# Patient Record
Sex: Female | Born: 1985 | Race: Black or African American | Hispanic: No | Marital: Married | Smoking: Former smoker
Health system: Southern US, Community
[De-identification: ages and names within clinical notes are randomized; demographics above are authoritative.]

## PROBLEM LIST (undated history)

## (undated) DIAGNOSIS — N83209 Unspecified ovarian cyst, unspecified side: Secondary | ICD-10-CM

## (undated) DIAGNOSIS — R87619 Unspecified abnormal cytological findings in specimens from cervix uteri: Secondary | ICD-10-CM

## (undated) DIAGNOSIS — F419 Anxiety disorder, unspecified: Secondary | ICD-10-CM

## (undated) DIAGNOSIS — A048 Other specified bacterial intestinal infections: Secondary | ICD-10-CM

## (undated) DIAGNOSIS — R51 Headache: Secondary | ICD-10-CM

## (undated) DIAGNOSIS — IMO0002 Reserved for concepts with insufficient information to code with codable children: Secondary | ICD-10-CM

## (undated) DIAGNOSIS — K219 Gastro-esophageal reflux disease without esophagitis: Secondary | ICD-10-CM

## (undated) HISTORY — DX: Other specified bacterial intestinal infections: A04.8

## (undated) HISTORY — PX: ENDOMETRIAL BIOPSY: SHX622

## (undated) HISTORY — PX: CERVICAL BIOPSY: SHX590

## (undated) HISTORY — PX: TUBAL LIGATION: SHX77

---

## 2004-10-27 ENCOUNTER — Inpatient Hospital Stay (HOSPITAL_COMMUNITY): Admission: AD | Admit: 2004-10-27 | Discharge: 2004-10-27 | Payer: Self-pay | Admitting: Obstetrics

## 2004-11-14 ENCOUNTER — Inpatient Hospital Stay (HOSPITAL_COMMUNITY): Admission: AD | Admit: 2004-11-14 | Discharge: 2004-11-14 | Payer: Self-pay | Admitting: Obstetrics

## 2004-11-18 ENCOUNTER — Inpatient Hospital Stay (HOSPITAL_COMMUNITY): Admission: AD | Admit: 2004-11-18 | Discharge: 2004-11-20 | Payer: Self-pay | Admitting: Obstetrics

## 2006-01-27 DIAGNOSIS — A048 Other specified bacterial intestinal infections: Secondary | ICD-10-CM

## 2006-01-27 HISTORY — DX: Other specified bacterial intestinal infections: A04.8

## 2006-03-24 ENCOUNTER — Inpatient Hospital Stay (HOSPITAL_COMMUNITY): Admission: AD | Admit: 2006-03-24 | Discharge: 2006-03-26 | Payer: Self-pay | Admitting: Obstetrics

## 2008-02-18 ENCOUNTER — Emergency Department (HOSPITAL_COMMUNITY): Admission: EM | Admit: 2008-02-18 | Discharge: 2008-02-18 | Payer: Self-pay | Admitting: Emergency Medicine

## 2008-03-13 ENCOUNTER — Ambulatory Visit: Payer: Self-pay | Admitting: Gastroenterology

## 2008-03-13 DIAGNOSIS — R112 Nausea with vomiting, unspecified: Secondary | ICD-10-CM

## 2008-03-13 DIAGNOSIS — R197 Diarrhea, unspecified: Secondary | ICD-10-CM

## 2008-03-14 ENCOUNTER — Encounter: Payer: Self-pay | Admitting: Gastroenterology

## 2008-03-14 LAB — CONVERTED CEMR LAB
ALT: 14 units/L (ref 0–35)
AST: 19 units/L (ref 0–37)
Alkaline Phosphatase: 33 units/L — ABNORMAL LOW (ref 39–117)
BUN: 7 mg/dL (ref 6–23)
Basophils Relative: 0.2 % (ref 0.0–3.0)
CO2: 29 meq/L (ref 19–32)
Calcium: 9.9 mg/dL (ref 8.4–10.5)
Creatinine, Ser: 0.9 mg/dL (ref 0.4–1.2)
Eosinophils Relative: 3 % (ref 0.0–5.0)
GFR calc Af Amer: 100 mL/min
GFR calc non Af Amer: 82 mL/min
Glucose, Bld: 88 mg/dL (ref 70–99)
HCT: 37.8 % (ref 36.0–46.0)
Hemoglobin: 12.9 g/dL (ref 12.0–15.0)
MCV: 95.2 fL (ref 78.0–100.0)
Monocytes Absolute: 0.4 10*3/uL (ref 0.1–1.0)
Neutrophils Relative %: 52 % (ref 43.0–77.0)
Platelets: 325 10*3/uL (ref 150–400)
RBC: 3.97 M/uL (ref 3.87–5.11)
RDW: 12.5 % (ref 11.5–14.6)
Sodium: 140 meq/L (ref 135–145)
Total Protein: 7 g/dL (ref 6.0–8.3)

## 2008-03-31 ENCOUNTER — Ambulatory Visit: Payer: Self-pay | Admitting: Gastroenterology

## 2008-04-03 ENCOUNTER — Ambulatory Visit (HOSPITAL_COMMUNITY): Admission: RE | Admit: 2008-04-03 | Discharge: 2008-04-03 | Payer: Self-pay | Admitting: Gastroenterology

## 2008-04-03 LAB — CONVERTED CEMR LAB: TSH: 1.56 microintl units/mL (ref 0.35–5.50)

## 2008-04-12 ENCOUNTER — Ambulatory Visit: Payer: Self-pay | Admitting: Gastroenterology

## 2008-04-12 ENCOUNTER — Encounter: Payer: Self-pay | Admitting: Gastroenterology

## 2008-04-12 HISTORY — PX: ESOPHAGOGASTRODUODENOSCOPY: SHX1529

## 2008-04-17 ENCOUNTER — Encounter: Payer: Self-pay | Admitting: Gastroenterology

## 2008-06-20 ENCOUNTER — Ambulatory Visit: Payer: Self-pay | Admitting: Gastroenterology

## 2008-09-20 ENCOUNTER — Emergency Department (HOSPITAL_COMMUNITY): Admission: EM | Admit: 2008-09-20 | Discharge: 2008-09-21 | Payer: Self-pay | Admitting: Emergency Medicine

## 2008-12-19 ENCOUNTER — Ambulatory Visit: Payer: Self-pay | Admitting: Gastroenterology

## 2008-12-19 DIAGNOSIS — R1084 Generalized abdominal pain: Secondary | ICD-10-CM | POA: Insufficient documentation

## 2008-12-27 ENCOUNTER — Ambulatory Visit: Payer: Self-pay | Admitting: Nurse Practitioner

## 2009-01-08 ENCOUNTER — Ambulatory Visit (HOSPITAL_COMMUNITY): Admission: RE | Admit: 2009-01-08 | Discharge: 2009-01-08 | Payer: Self-pay | Admitting: Gastroenterology

## 2009-01-28 ENCOUNTER — Inpatient Hospital Stay (HOSPITAL_COMMUNITY): Admission: AD | Admit: 2009-01-28 | Discharge: 2009-01-28 | Payer: Self-pay | Admitting: Obstetrics and Gynecology

## 2009-02-27 LAB — CONVERTED CEMR LAB

## 2009-06-27 ENCOUNTER — Telehealth: Payer: Self-pay | Admitting: Gastroenterology

## 2009-06-29 ENCOUNTER — Emergency Department (HOSPITAL_COMMUNITY): Admission: EM | Admit: 2009-06-29 | Discharge: 2009-06-29 | Payer: Self-pay | Admitting: Emergency Medicine

## 2009-09-28 ENCOUNTER — Emergency Department (HOSPITAL_COMMUNITY): Admission: EM | Admit: 2009-09-28 | Discharge: 2009-09-28 | Payer: Self-pay | Admitting: Emergency Medicine

## 2010-01-15 ENCOUNTER — Ambulatory Visit: Payer: Self-pay | Admitting: Internal Medicine

## 2010-01-15 DIAGNOSIS — R51 Headache: Secondary | ICD-10-CM

## 2010-01-15 DIAGNOSIS — R519 Headache, unspecified: Secondary | ICD-10-CM | POA: Insufficient documentation

## 2010-01-16 LAB — CONVERTED CEMR LAB
AST: 19 units/L (ref 0–37)
Albumin: 4.1 g/dL (ref 3.5–5.2)
Alkaline Phosphatase: 34 units/L — ABNORMAL LOW (ref 39–117)
BUN: 10 mg/dL (ref 6–23)
Basophils Relative: 0.5 % (ref 0.0–3.0)
Calcium: 9.4 mg/dL (ref 8.4–10.5)
Chloride: 105 meq/L (ref 96–112)
HCT: 36.1 % (ref 36.0–46.0)
HDL: 60.8 mg/dL (ref >39.00)
LDL Cholesterol: 101 mg/dL — ABNORMAL HIGH (ref 0–99)
Leukocytes, UA: NEGATIVE
Neutro Abs: 5 10*3/uL (ref 1.4–7.7)
Neutrophils Relative %: 54.8 % (ref 43.0–77.0)
Platelets: 298 10*3/uL (ref 150.0–400.0)
Potassium: 4.2 meq/L (ref 3.5–5.1)
RDW: 13.2 % (ref 11.5–14.6)
Total Bilirubin: 0.6 mg/dL (ref 0.3–1.2)
Total Protein: 7 g/dL (ref 6.0–8.3)
Triglycerides: 69 mg/dL (ref 0.0–149.0)
VLDL: 13.8 mg/dL (ref 0.0–40.0)
WBC: 9.2 10*3/uL (ref 4.5–10.5)
pH: 7 (ref 5.0–8.0)

## 2010-01-17 ENCOUNTER — Telehealth: Payer: Self-pay | Admitting: Internal Medicine

## 2010-01-17 ENCOUNTER — Encounter (INDEPENDENT_AMBULATORY_CARE_PROVIDER_SITE_OTHER): Payer: Self-pay | Admitting: *Deleted

## 2010-02-26 NOTE — Progress Notes (Signed)
Summary: CONSTIPATION  Phone Note Call from Patient Call back at Home Phone [phone]   Caller: Renee Pain daughter in law. Call For: Dr Christella Hartigan Reason for Call: Talk to Nurse Summary of Call: No Bm's since last thursday.  Initial call taken by: Leanor Kail [facility],  June 27, 2009 3:15 PM  Follow-up for Phone Call        Pt. c/o constipation, no BM for 5 days, then had dark, loose stools and vomited on Thursday. NO BM since then.  Today pt. feels very bloated, some abd. pain. Throat is sore & swollen, feels she may have strep. throat, will see her PCP about that.   1) Miralax 17gm mixed in 8oz. water or juice-daily.May use BID PRN.  Take a dose as soon as you get it, another at bedtime tonight and again in the morning.  2) Call with an update tomorrow, sooner if needed 3) If symptoms become worse call back immediately or go to ER. 4) I will call pt., if new orders, after MD reviews.   Follow-up by: Laureen Ochs LPN,  June 27, 6576 4:32 PM  Additional Follow-up for Phone Call Additional follow up Details #1::        agree, no changes Additional Follow-up by: Rachael Fee MD,  June 28, 2009 7:26 AM

## 2010-02-28 NOTE — Letter (Signed)
Summary: Out of Work  [facility]  [address] [place], [place] [address]   Phone: [phone]  Fax: [phone]    January 17, 2010   Employee:  Declynn Fiske    To Whom It May Concern:   For Medical reasons, please excuse the above named employee from work for the following dates:  Start:   Thursday December 22th 2011  End:   Saturday December 24th 2011 - To return Monday December 26th 2011  If you need additional information, please feel free to contact our office.         Sincerely,    Margaret Pyle, CMA  Rene Paci MD

## 2010-02-28 NOTE — Assessment & Plan Note (Signed)
Summary: NEW PT/[employer]/#/LB   Vital Signs:  Patient profile:   25 year old female Height:      66 inches (167.64 cm) Weight:      172.4 pounds (78.36 kg) BMI:     27.93 O2 Sat:      99 % on Room air Temp:     98.3 degrees F (36.83 degrees C) oral Pulse rate:   59 / minute BP sitting:   100 / 62  (left arm) Cuff size:   regular  Vitals Entered By: Orlan Leavens RMA (January 15, 2010 3:04 PM)  O2 Flow:  Room air CC: New patient Is Patient Diabetic? No Pain Assessment Patient in pain? no        Primary Care Provider:  Newt Lukes MD  CC:  New patient.  History of Present Illness: new pt to me and our division - here to est care patient is here today for annual physical. Patient feels well and has no complaints.   concern about  diarrhea assoc with occ epigastric bloating and pain along right abd not changed with type of food or fasting c/o infreq BM - every 4-5 days, - cramping and diarrhea "all day" when BM does occur no fever, no brbpr - no weight loss no n/v no international or ship travel, no outdoor exposures no new foods - denies stress or depression/anxiety  Preventive Screening-Counseling & Management  Alcohol-Tobacco     Alcohol drinks/day: 0     Alcohol Counseling: not indicated; patient does not drink     Smoking Status: never     Tobacco Counseling: not indicated; no tobacco use  Caffeine-Diet-Exercise     Diet Counseling: to improve diet; diet is suboptimal     Does Patient Exercise: no     Exercise Counseling: to improve exercise regimen     Depression Counseling: not indicated; screening negative for depression  Safety-Violence-Falls     Seat Belt Counseling: not indicated; patient wears seat belts     Helmet Counseling: not indicated; patient wears helmet when riding bicycle/motocycle     Firearm Counseling: not applicable     Violence Counseling: not applicable     Fall Risk Counseling: not indicated; no significant falls  noted  Clinical Review Panels:  Prevention   Last Pap Smear:  Interpretation Result:Negative for intraepithelial Lesion or Malignancy.    (02/27/2009)  CBC   WBC:  7.1 (03/13/2008)   RBC:  3.97 (03/13/2008)   Hgb:  12.9 (03/13/2008)   Hct:  37.8 (03/13/2008)   Platelets:  325 (03/13/2008)   MCV  95.2 (03/13/2008)   MCHC  34.2 (03/13/2008)   RDW  12.5 (03/13/2008)   PMN:  52.0 (03/13/2008)   Lymphs:  38.5 (03/13/2008)   Monos:  6.3 (03/13/2008)   Eosinophils:  3.0 (03/13/2008)   Basophil:  0.2 (03/13/2008)  Complete Metabolic Panel   Glucose:  88 (03/13/2008)   Sodium:  140 (03/13/2008)   Potassium:  4.3 (03/13/2008)   Chloride:  105 (03/13/2008)   CO2:  29 (03/13/2008)   BUN:  7 (03/13/2008)   Creatinine:  0.9 (03/13/2008)   Albumin:  4.1 (03/13/2008)   Total Protein:  7.0 (03/13/2008)   Calcium:  9.9 (03/13/2008)   Total Bili:  0.7 (03/13/2008)   Alk Phos:  33 (03/13/2008)   SGPT (ALT):  14 (03/13/2008)   SGOT (AST):  19 (03/13/2008)   Current Medications (verified): 1)  Mirena 20 Mcg/24hr Iud (Levonorgestrel) .... 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 I have reviewed all other systems and they were negative.   Physical Exam  General:  overweight-appearing.  alert, well-developed, well-nourished, and cooperative to examination.    Head:  Normocephalic and atraumatic without obvious abnormalities. No apparent alopecia or balding. Eyes:  vision grossly intact; pupils equal, round and reactive to  light.  conjunctiva and lids normal.    Ears:  normal pinnae bilaterally, without erythema, swelling, or tenderness to palpation. TMs clear, without effusion, or cerumen impaction. Hearing grossly normal bilaterally  Mouth:  teeth and gums in good repair; mucous membranes moist, without lesions or ulcers. oropharynx clear without exudate, no erythema.  Neck:  thick, supple, full ROM, no masses, no thyromegaly; no thyroid nodules or tenderness. no JVD or carotid bruits.   Lungs:  normal respiratory effort, no intercostal retractions or use of accessory muscles; normal breath sounds bilaterally - no crackles and no wheezes.    Heart:  normal rate, regular rhythm, no murmur, and no rub. BLE without edema. normal DP pulses and normal cap refill in all 4 extremities    Abdomen:  soft, non-tender, normal bowel sounds, no distention; no masses and no appreciable hepatomegaly or splenomegaly.   Msk:  No deformity or scoliosis noted of thoracic or lumbar spine.   Neurologic:  alert & oriented X3 and cranial nerves II-XII symetrically intact.  strength normal in all extremities, sensation intact to light touch, and gait normal. speech fluent without dysarthria or aphasia; follows commands with good comprehension.  Skin:  mild foillicular acne around waistline - otherwise, no rashes, vesicles, ulcers, or erythema. No nodules or irregularity to palpation.  Psych:  Oriented X3, memory intact for recent and remote, normally interactive, good eye contact, not anxious appearing, not depressed appearing, and not agitated.      Impression & Recommendations:  Problem # 1:  PREVENTIVE HEALTH CARE (ICD-V70.0) Patient has been counseled on age-appropriate routine health concerns for screening and prevention. These are reviewed and up-to-date. Immunizations are up-to-date or declined. Labs ordered - to be reviewed.  Orders: TLB-Lipid Panel (80061-LIPID) TLB-BMP (Basic Metabolic Panel-BMET) (80048-METABOL) TLB-CBC  Platelet - w/Differential (85025-CBCD) TLB-Hepatic/Liver Function Pnl (80076-HEPATIC) TLB-TSH (Thyroid Stimulating Hormone) (84443-TSH) TLB-Udip w/ Micro (81001-URINE)  Problem # 2:  DIARRHEA (ICD-787.91) hx same - ?IBS given constipation alt with diarrhea by hx - exam benign - labs to be screened with cpx as above - trial probiotic rec - if labs ok and cont symptoms, f/u for further eval as needed - pt understands and agrees to same Her updated medication list for this problem includes:    Align Caps (Probiotic product) .Marland Kitchen... 1 by mouth once daily x 2 weeks, then as needed for stomach symptoms  Complete Medication List: 1)  Mirena 20 Mcg/24hr Iud (Levonorgestrel) .... Replace every 5 years 2)  Align Caps (Probiotic product) .Marland Kitchen.. 1 by mouth once daily x 2 weeks, then as needed for stomach symptoms  Patient Instructions: 1)  it was good to see you today. 2)  test(s) ordered today - your results will be posted on the phone tree for review in 48-72 hours from the time of test completion; call [phone] and enter your 9 digit MRN (listed above on this page, just below your name); if any changes need to be made or there are abnormal results, you will be contacted directly. 3)  use Align (or other probioitc) daily x 2 weeks for your stomach symptoms - sample and coupon given to you today 4)  Please schedule a follow-up appointment in 2-4 weeks to review stomach problems and decide if other workup is needed, call sooner if problems.    Orders Added: 1)  TLB-Lipid Panel [80061-LIPID] 2)  TLB-BMP (Basic Metabolic Panel-BMET) [80048-METABOL] 3)  TLB-CBC Platelet - w/Differential [85025-CBCD] 4)  TLB-Hepatic/Liver Function Pnl [80076-HEPATIC] 5)  TLB-TSH (Thyroid Stimulating Hormone) [84443-TSH] 6)  TLB-Udip w/ Micro [81001-URINE] 7)  New Patient 18-39 years [99385]   Immunization History:  Tetanus/Td Immunization History:    Tetanus/Td:  historical (01/27/2009)   Immunization  History:  Tetanus/Td Immunization History:    Tetanus/Td:  Historical (01/27/2009)   Pap Smear  Procedure date:  02/27/2009  Findings:      Interpretation Result:Negative for intraepithelial Lesion or Malignancy.

## 2010-02-28 NOTE — Progress Notes (Signed)
Summary: Diarrhea/ work note  Phone Note Call from Patient Call back at [employer] [phone]   Caller: Patient Summary of Call: Pt called stating she is still having Diarrhea but has also vomitted this morning. Pt is feeling weak but is staying hydrated. Pt is requesting out of work note for today, tomorrow and Saturday, return Monday. Please adivse. Initial call taken by: Margaret Pyle, CMA,  January 17, 2010 10:38 AM  Follow-up for Phone Call        ok to generate work note as requested - if unable to return to work on Mon, will need ROV to reeval- thanks Follow-up by: Newt Lukes MD,  January 17, 2010 11:08 AM  Additional Follow-up for Phone Call Additional follow up Details #1::        Pt advised, letter in cabinet for pt pick up Additional Follow-up by: Margaret Pyle, CMA,  January 17, 2010 11:25 AM

## 2010-03-18 ENCOUNTER — Encounter (INDEPENDENT_AMBULATORY_CARE_PROVIDER_SITE_OTHER): Payer: Self-pay | Admitting: *Deleted

## 2010-03-18 ENCOUNTER — Ambulatory Visit (INDEPENDENT_AMBULATORY_CARE_PROVIDER_SITE_OTHER): Payer: 59 | Admitting: Internal Medicine

## 2010-03-18 ENCOUNTER — Encounter: Payer: Self-pay | Admitting: Internal Medicine

## 2010-03-18 DIAGNOSIS — J209 Acute bronchitis, unspecified: Secondary | ICD-10-CM | POA: Insufficient documentation

## 2010-03-19 ENCOUNTER — Telehealth: Payer: Self-pay | Admitting: Internal Medicine

## 2010-03-19 ENCOUNTER — Encounter (INDEPENDENT_AMBULATORY_CARE_PROVIDER_SITE_OTHER): Payer: Self-pay | Admitting: *Deleted

## 2010-03-26 NOTE — Progress Notes (Signed)
Summary: Work Note   Phone Note Call from Patient Call back at [employer] [phone] [facility]     Caller: Patient Summary of Call: Pt called requesting work note begin Feb 18 to Feb 25. Okay to change? Initial call taken by: Margaret Pyle, CMA,  March 19, 2010 11:15 AM  Follow-up for Phone Call        ok Follow-up by: Newt Lukes MD,  March 19, 2010 12:22 PM  Additional Follow-up for Phone Call Additional follow up Details #1::        Pt advised, letter generated and placed in cabinet for pick up Additional Follow-up by: Margaret Pyle, CMA,  March 19, 2010 2:08 PM

## 2010-03-26 NOTE — Letter (Signed)
Summary: Out of Work  [facility]  [address] [place], [place] [address]   Phone: [phone]  Fax: [phone]    March 18, 2010   Employee:  Emma Gardner    To Whom It May Concern:   For Medical reasons, please excuse the above named employee from work for the following dates:  Start: 03/18/10    End: 03/23/10    If you need additional information, please feel free to contact our office.         Sincerely,    Dr. Rene Paci

## 2010-03-26 NOTE — Letter (Signed)
Summary: Out of Work  [facility]  [address] [place], [place] [address]   Phone: [phone]  Fax: [phone]    March 19, 2010   Employee:  Emma Gardner    To Whom It May Concern:   For Medical reasons, please excuse the above named employee from work for the following dates:  Start:   03/16/2010  End:   03/23/2010  If you need additional information, please feel free to contact our office.         Sincerely,    Margaret Pyle, CMA for Dr Rene Paci

## 2010-03-26 NOTE — Assessment & Plan Note (Signed)
Summary: sore throat/sinus inf   Vital Signs:  Patient profile:   25 year old female Height:      66 inches (167.64 cm) Weight:      172 pounds (78.18 kg) O2 Sat:      98 % on Room air Temp:     99.5 degrees F (37.50 degrees C) oral Pulse rate:   83 / minute BP sitting:   102 / 82  (left arm) Cuff size:   large  Vitals Entered By: Orlan Leavens RMA (March 18, 2010 2:38 PM)  O2 Flow:  Room air CC: sore throat/ chills/ bodyache x's 4 days, URI symptoms Is Patient Diabetic? No Pain Assessment Patient in pain? no        Primary Care Provider:  Newt Lukes MD  CC:  sore throat/ chills/ bodyache x's 4 days and URI symptoms.  History of Present Illness:  URI Symptoms      This is a 24 year old woman who presents with URI symptoms.  The symptoms began 4 days ago.  The severity is described as moderate.  progressive symptoms since onset.  The patient reports nasal congestion, sore throat, dry cough, productive cough, earache, and sick contacts, but denies clear nasal discharge and purulent nasal discharge.  Associated symptoms include fever.  The patient denies stiff neck, dyspnea, wheezing, rash, vomiting, diarrhea, and use of an antipyretic.  The patient also reports headache, muscle aches, and severe fatigue.  The patient denies itchy watery eyes, sneezing, seasonal symptoms, and response to antihistamine.  The patient denies the following risk factors for Strep sinusitis: unilateral facial pain, unilateral nasal discharge, double sickening, tooth pain, Strep exposure, and tender adenopathy.    Clinical Review Panels:  CBC   WBC:  9.2 (01/15/2010)   RBC:  3.79 (01/15/2010)   Hgb:  12.4 (01/15/2010)   Hct:  36.1 (01/15/2010)   Platelets:  298.0 (01/15/2010)   MCV  95.3 (01/15/2010)   MCHC  34.5 (01/15/2010)   RDW  13.2 (01/15/2010)   PMN:  54.8 (01/15/2010)   Lymphs:  35.5 (01/15/2010)   Monos:  6.5 (01/15/2010)   Eosinophils:  2.7 (01/15/2010)   Basophil:  0.5  (01/15/2010)  Complete Metabolic Panel   Glucose:  81 (01/15/2010)   Sodium:  139 (01/15/2010)   Potassium:  4.2 (01/15/2010)   Chloride:  105 (01/15/2010)   CO2:  31 (01/15/2010)   BUN:  10 (01/15/2010)   Creatinine:  0.8 (01/15/2010)   Albumin:  4.1 (01/15/2010)   Total Protein:  7.0 (01/15/2010)   Calcium:  9.4 (01/15/2010)   Total Bili:  0.6 (01/15/2010)   Alk Phos:  34 (01/15/2010)   SGPT (ALT):  13 (01/15/2010)   SGOT (AST):  19 (01/15/2010)   Current Medications (verified): 1)  Mirena 20 Mcg/24hr Iud (Levonorgestrel) .... Replace Every 5 Years 2)  Biomedical engineer (Probiotic Product) .Marland Kitchen.. 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  Review of Systems  The patient denies weight loss, syncope, and hemoptysis.    Physical Exam  General:  overweight-appearing.  alert, well-developed, well-nourished, and cooperative to examination.  mod ill and  tired, spouse at side  Head:  Normocephalic and atraumatic without obvious abnormalities. No apparent alopecia or balding. Eyes:  vision grossly intact; pupils equal, round and reactive to light.  conjunctiva and lids normal.    Ears:  normal pinnae bilaterally, without erythema, swelling, or tenderness to palpation. TMs clear, without effusion, or cerumen impaction. Hearing grossly normal bilaterally  Mouth:  teeth and gums in good repair; mucous membranes moist, without lesions or ulcers. oropharynx clear without exudate, mod erythema. no pnd Neck:  supple, full ROM, no masses, no thyromegaly; no thyroid nodules or tenderness. no JVD or carotid bruits.  shoddy LAD Lungs:  normal respiratory effort,  no intercostal retractions or use of accessory muscles; few rhonchi breath sounds bilaterally - no crackles and no wheezes.    Heart:  normal rate, regular rhythm, no murmur, and no rub. BLE without edema. Neurologic:  alert & oriented X3 and cranial nerves II-XII symetrically intact.  strength normal in all extremities, sensation intact to light touch, and gait normal. speech fluent without dysarthria or aphasia; follows commands with good comprehension.    Impression & Recommendations:  Problem # 1:  ACUTE BRONCHITIS (ICD-466.0)  Take antibiotics and other medications as directed. Encouraged to push clear liquids, get enough rest, and take acetaminophen as needed. To be seen in 5-7 days if no improvement, sooner if worse.  Orders: Prescription Created Electronically [id number])  Her updated medication list for this problem includes:    Azithromycin 250 Mg Tabs (Azithromycin) .Marland Kitchen... 2 tabs by mouth today, then 1 by mouth daily starting tomorrow    Promethazine-codeine 6.25-10 Mg/71ml Syrp (Promethazine-codeine) .Marland Kitchen... 1 tsp by mouth every 4-6 hours as needed for cough symptoms  Complete Medication List: 1)  Mirena 20 Mcg/24hr Iud (Levonorgestrel) .... Replace every 5 years 2)  Align Caps (Probiotic product) .Marland Kitchen.. 1 by mouth once daily x 2 weeks, then as needed for stomach symptoms 3)  Azithromycin 250 Mg Tabs (Azithromycin) .... 2 tabs by mouth today, then 1 by mouth daily starting tomorrow 4)  Promethazine-codeine 6.25-10 Mg/44ml Syrp (Promethazine-codeine) .Marland Kitchen.. 1 tsp by mouth every 4-6 hours as needed for cough symptoms  Patient Instructions: 1)  it was good to see you today. 2)  Zpak and codiene cough syrup - your prescriptions have been submitted to your pharmacy. Please take as directed. Contact our office if you believe you're having problems with the medication(s).  3)  Get plenty of rest, drink lots of clear liquids, and use Tylenol or Ibuprofen for fever and comfort. Return in 7-10 days  if you're not better:sooner if you're feeling worse. 4)  work note provided today thru sat 2/25 Prescriptions: PROMETHAZINE-CODEINE 6.25-10 MG/5ML SYRP (PROMETHAZINE-CODEINE) 1 tsp by mouth every 4-6 hours as needed for cough symptoms  #6oz x 0   Entered and Authorized by:   Newt Lukes MD   Signed by:   Newt Lukes MD on 03/18/2010   Method used:   Printed then faxed to ...       [facility]       [address]       [place], [place]  [address]       Ph: [phone]       Fax: [phone]   RxID:   [id number] AZITHROMYCIN 250 MG TABS (AZITHROMYCIN) 2 tabs by mouth today, then 1 by mouth daily starting tomorrow  #6 x 0   Entered and Authorized by:  Newt Lukes MD   Signed by:   Newt Lukes MD on 03/18/2010   Method used:   Electronically to        [facility])       [address]       [place], [place]  [address]       Ph: [phone]       Fax: [phone]   RxID:   [id number]    Orders Added: 1)  Est. Patient Level IV [01751] 2)  Prescription Created Electronically [phone]

## 2010-04-14 LAB — CBC: RBC: 3.75 MIL/uL — ABNORMAL LOW (ref 3.87–5.11)

## 2010-04-14 LAB — URINE MICROSCOPIC-ADD ON

## 2010-04-14 LAB — WET PREP, GENITAL: Trich, Wet Prep: NONE SEEN

## 2010-04-14 LAB — GC/CHLAMYDIA PROBE AMP, GENITAL
Chlamydia, DNA Probe: NEGATIVE
GC Probe Amp, Genital: NEGATIVE

## 2010-04-14 LAB — DIFFERENTIAL
Basophils Relative: 0 % (ref 0–1)
Eosinophils Absolute: 0.4 10*3/uL (ref 0.0–0.7)
Eosinophils Relative: 4 % (ref 0–5)
Monocytes Absolute: 1.2 10*3/uL — ABNORMAL HIGH (ref 0.1–1.0)
Monocytes Relative: 13 % — ABNORMAL HIGH (ref 3–12)

## 2010-04-14 LAB — URINALYSIS, ROUTINE W REFLEX MICROSCOPIC
Glucose, UA: NEGATIVE mg/dL
Nitrite: NEGATIVE
Protein, ur: NEGATIVE mg/dL

## 2010-04-14 LAB — POCT PREGNANCY, URINE: Preg Test, Ur: NEGATIVE

## 2010-05-04 LAB — POCT I-STAT, CHEM 8
BUN: 9 mg/dL (ref 6–23)
Chloride: 103 mEq/L (ref 96–112)
Glucose, Bld: 90 mg/dL (ref 70–99)
HCT: 39 % (ref 36.0–46.0)
Sodium: 138 mEq/L (ref 135–145)
TCO2: 24 mmol/L (ref 0–100)

## 2010-05-13 LAB — MONONUCLEOSIS SCREEN: Mono Screen: NEGATIVE

## 2010-05-13 LAB — URINALYSIS, ROUTINE W REFLEX MICROSCOPIC
Ketones, ur: 15 mg/dL — AB
Leukocytes, UA: NEGATIVE
Nitrite: NEGATIVE
Protein, ur: NEGATIVE mg/dL
Urobilinogen, UA: 0.2 mg/dL (ref 0.0–1.0)

## 2010-05-13 LAB — COMPREHENSIVE METABOLIC PANEL
Alkaline Phosphatase: 29 U/L — ABNORMAL LOW (ref 39–117)
Calcium: 9.4 mg/dL (ref 8.4–10.5)
Creatinine, Ser: 0.85 mg/dL (ref 0.4–1.2)
GFR calc Af Amer: >60 mL/min (ref >60)
GFR calc non Af Amer: >60 mL/min (ref >60)
Sodium: 138 mEq/L (ref 135–145)
Total Bilirubin: 0.8 mg/dL (ref 0.3–1.2)

## 2010-05-13 LAB — RAPID STREP SCREEN (MED CTR MEBANE ONLY): Streptococcus, Group A Screen (Direct): NEGATIVE

## 2010-05-13 LAB — DIFFERENTIAL
Eosinophils Relative: 0 % (ref 0–5)
Monocytes Relative: 7 % (ref 3–12)
Neutro Abs: 10.3 10*3/uL — ABNORMAL HIGH (ref 1.7–7.7)
Neutrophils Relative %: 87 % — ABNORMAL HIGH (ref 43–77)

## 2010-05-13 LAB — CBC
HCT: 41 % (ref 36.0–46.0)
Hemoglobin: 13.8 g/dL (ref 12.0–15.0)
MCV: 94.9 fL (ref 78.0–100.0)
Platelets: 274 10*3/uL (ref 150–400)

## 2010-05-13 LAB — URINE MICROSCOPIC-ADD ON

## 2010-08-09 ENCOUNTER — Encounter (HOSPITAL_COMMUNITY): Payer: Self-pay | Admitting: *Deleted

## 2010-08-09 ENCOUNTER — Inpatient Hospital Stay (HOSPITAL_COMMUNITY)
Admission: AD | Admit: 2010-08-09 | Discharge: 2010-08-09 | Disposition: A | Discharge status: Home / Self Care | Payer: Medicaid Other | Source: Ambulatory Visit | Attending: Obstetrics and Gynecology | Admitting: Obstetrics and Gynecology

## 2010-08-09 ENCOUNTER — Inpatient Hospital Stay (HOSPITAL_COMMUNITY): Payer: Medicaid Other

## 2010-08-09 DIAGNOSIS — N83209 Unspecified ovarian cyst, unspecified side: Secondary | ICD-10-CM

## 2010-08-09 DIAGNOSIS — Z30431 Encounter for routine checking of intrauterine contraceptive device: Secondary | ICD-10-CM | POA: Insufficient documentation

## 2010-08-09 DIAGNOSIS — R109 Unspecified abdominal pain: Secondary | ICD-10-CM | POA: Insufficient documentation

## 2010-08-09 HISTORY — DX: Unspecified ovarian cyst, unspecified side: N83.209

## 2010-08-09 LAB — COMPREHENSIVE METABOLIC PANEL
ALT: 19 U/L (ref 0–35)
Alkaline Phosphatase: 37 U/L — ABNORMAL LOW (ref 39–117)
BUN: 11 mg/dL (ref 6–23)
CO2: 28 mEq/L (ref 19–32)
GFR calc Af Amer: >60 mL/min (ref >60)
GFR calc non Af Amer: >60 mL/min (ref >60)
Glucose, Bld: 84 mg/dL (ref 70–99)
Potassium: 4.1 mEq/L (ref 3.5–5.1)
Total Protein: 7.2 g/dL (ref 6.0–8.3)

## 2010-08-09 LAB — URINALYSIS, ROUTINE W REFLEX MICROSCOPIC
Bilirubin Urine: NEGATIVE
Ketones, ur: NEGATIVE mg/dL
Nitrite: NEGATIVE
Protein, ur: NEGATIVE mg/dL
Urobilinogen, UA: 0.2 mg/dL (ref 0.0–1.0)
pH: 6.5 (ref 5.0–8.0)

## 2010-08-09 LAB — DIFFERENTIAL
Eosinophils Absolute: 0.2 10*3/uL (ref 0.0–0.7)
Eosinophils Relative: 3 % (ref 0–5)
Lymphocytes Relative: 38 % (ref 12–46)
Lymphs Abs: 3.2 10*3/uL (ref 0.7–4.0)
Monocytes Relative: 7 % (ref 3–12)
Neutrophils Relative %: 53 % (ref 43–77)

## 2010-08-09 LAB — CBC
Hemoglobin: 12.1 g/dL (ref 12.0–15.0)
MCH: 30.8 pg (ref 26.0–34.0)
MCV: 92.1 fL (ref 78.0–100.0)
RBC: 3.93 MIL/uL (ref 3.87–5.11)
WBC: 8.4 10*3/uL (ref 4.0–10.5)

## 2010-08-09 MED ORDER — ONDANSETRON 8 MG PO TBDP
8.0000 mg | ORAL_TABLET | Freq: Once | ORAL | Status: AC
  Administered 2010-08-09: 8 mg via ORAL
  Filled 2010-08-09: qty 1

## 2010-08-09 MED ORDER — KETOROLAC TROMETHAMINE 60 MG/2ML IM SOLN
60.0000 mg | Freq: Once | INTRAMUSCULAR | Status: AC
Start: 2010-08-09 — End: 2010-08-09
  Administered 2010-08-09: 60 mg via INTRAMUSCULAR
  Filled 2010-08-09: qty 2

## 2010-08-09 MED ORDER — OXYCODONE-ACETAMINOPHEN 5-325 MG PO TABS: 1.0000 | ORAL_TABLET | Freq: Four times a day (QID) | ORAL | Status: AC | PRN

## 2010-08-09 MED ORDER — ONDANSETRON 8 MG PO TBDP: 8.0000 mg | ORAL_TABLET | Freq: Two times a day (BID) | ORAL | Status: AC | PRN

## 2010-08-09 MED ORDER — OXYCODONE-ACETAMINOPHEN 5-325 MG PO TABS
1.0000 | ORAL_TABLET | Freq: Once | ORAL | Status: AC
  Administered 2010-08-09: 1 via ORAL
  Filled 2010-08-09: qty 1

## 2010-08-09 NOTE — ED Provider Notes (Signed)
History     Chief Complaint  Patient presents with  . Abdominal Pain   Patient is a 25 y.o. female presenting with abdominal pain. The history is provided by the patient.  Abdominal Pain The primary symptoms of the illness include abdominal pain, nausea and dysuria. The primary symptoms of the illness do not include fever. The current episode started 3 to 5 hours ago. The onset of the illness was sudden. The problem has been gradually worsening.  Additional symptoms associated with the illness include chills and back pain.  States she has hx of large ovarian cyst. Has had problems in the past but today pain is severe. Pain starts in lower abdomen on the left side and radiates across lower abdomen and into back. Associated symptoms include headache, chills, nausea but no fever. Has IUD for birth control.   OB History    Grav Para Term Preterm Abortions TAB SAB Ect Mult Living                  Past Medical History  Diagnosis Date  . Ovarian cyst     Past Surgical History  Procedure Date  . Endometrial biopsy     normal    No family history on file.  History  Substance Use Topics  . Smoking status: Not on file  . Smokeless tobacco: Not on file  . Alcohol Use: Not on file    Allergies: No Known Allergies  No prescriptions prior to admission    Review of Systems  Constitutional: Positive for chills. Negative for fever.  Respiratory: Negative.   Gastrointestinal: Positive for nausea and abdominal pain.  Genitourinary: Positive for dysuria.  Musculoskeletal: Positive for back pain.  Skin: Negative.   Neurological: Positive for dizziness and headaches.   Physical Exam   Blood pressure 103/50, pulse 75, temperature 97.8 F (36.6 C), temperature source Oral, resp. rate 18.  Physical Exam  Constitutional: She is oriented to person, place, and time. She appears well-developed and well-nourished.  HENT:  Head: Normocephalic.  Eyes: Pupils are equal, round, and  reactive to light.  Neck: Neck supple.  Respiratory: Effort normal.  GI: Soft. There is tenderness.    Genitourinary: Tenderness: patient unable to tolorate pelvic exam due to pain.  Musculoskeletal: Normal range of motion.  Neurological: She is alert and oriented to person, place, and time.  Skin: Skin is warm and dry.    MAU Course  Procedures  Ultrasound show prob. Rupture of hemorrhagic cyst on the left. IUD placement appears malplaced. MDM  Pt. Returned from ultrasound and continues to have some pain after toradol.  Consult with Dr. Ambrose Mantle. Reviewed results of ultrasound findings. We will treat pain for ruptured ovarian cyst and have pt. F/u in the office with Dr. Senaida Ores for further evaluation of IUD placement.   [place], [place] 08/09/10 2022

## 2010-08-09 NOTE — Progress Notes (Signed)
Hx ovarian cyst, cannot stand due to pain, mostly on left, radiates to right, to room in wheelchair from car

## 2010-08-09 NOTE — ED Notes (Signed)
Labs being drawn, pt unable to void from abdominal pain, will wait on toradol to start working for pt to attempt to void, or may still get cath u/a if unable.

## 2010-09-03 ENCOUNTER — Ambulatory Visit (HOSPITAL_COMMUNITY): Admission: RE | Admit: 2010-09-03 | Payer: Self-pay | Source: Ambulatory Visit | Admitting: Obstetrics and Gynecology

## 2010-09-03 ENCOUNTER — Encounter (HOSPITAL_COMMUNITY): Admission: RE | Payer: Self-pay | Source: Ambulatory Visit

## 2010-09-03 SURGERY — LAPAROSCOPY OPERATIVE
Anesthesia: General

## 2010-10-24 ENCOUNTER — Encounter: Payer: Medicaid Other | Admitting: Obstetrics & Gynecology

## 2010-11-01 ENCOUNTER — Encounter: Payer: Medicaid Other | Admitting: Obstetrics & Gynecology

## 2010-11-01 ENCOUNTER — Ambulatory Visit (INDEPENDENT_AMBULATORY_CARE_PROVIDER_SITE_OTHER): Payer: Medicaid Other | Admitting: Obstetrics & Gynecology

## 2010-11-01 ENCOUNTER — Encounter: Payer: Self-pay | Admitting: Obstetrics & Gynecology

## 2010-11-01 VITALS — BP 120/80 | HR 67 | Temp 97.0°F | Wt 182.1 lb

## 2010-11-01 DIAGNOSIS — N949 Unspecified condition associated with female genital organs and menstrual cycle: Secondary | ICD-10-CM

## 2010-11-01 DIAGNOSIS — R102 Pelvic and perineal pain: Secondary | ICD-10-CM

## 2010-11-01 DIAGNOSIS — Z3009 Encounter for other general counseling and advice on contraception: Secondary | ICD-10-CM

## 2010-11-01 DIAGNOSIS — Z975 Presence of (intrauterine) contraceptive device: Secondary | ICD-10-CM

## 2010-11-01 MED ORDER — NORETHIN ACE-ETH ESTRAD-FE 1-20 MG-MCG PO TABS: 1.0000 | ORAL_TABLET | Freq: Every day | ORAL | Status: DC

## 2010-11-01 NOTE — Patient Instructions (Signed)
Oral Contraceptives (Birth Control Pills) Oral contraceptives (OCs) are medicines taken to prevent pregnancy. They are the most widely used method of birth control. OCs work by preventing the ovaries from releasing eggs. The OC hormones also cause the mucus on the cervix to thicken, preventing the sperm from entering the uterus. They also cause the lining of the uterus to become thin, not allowing a fertilized egg to attach to the inside of the uterus. OCs have a failure rate of less than 1%, when taken exactly as prescribed. THERE ARE 2 TYPES OF OC  OC that contains a mix of estrogen and progesterone hormones is the most common OC used. It is taken for 21 days, followed by 7 days of not taking the OC hormones. It can be packaged as 28 pills, with the last 7 pills being inactive. You take a pill every day. This way you do not need to remember when to restart taking the active pills. Most women will begin their menstrual period 2 to 3 days after taking the hormone pill. The menstrual period is usually lighter and shorter. This combination OC should not be taken if you are breast-feeding.   The progesterone only (minipill) OC does not contain estrogen. It is taken every day, continuously. You may have only spotting for a period, or no period at all. The progesterone only OC can be taken if you are breast-feeding your baby.  OCs come in:  Packs of 21 pills, with no pills to take for 7 days after the last pill.   Packs of 28 pills, with a pill to take every day. The last 7 pills are without hormones.   Packs of 91 pills (continuous or extended use), with a pill to take every day. The first 84 pills contain the hormones, and the last 7 pills do not. That is when you will have your menstrual period. You will not have a menstrual period during the time you are taking the first 84 pills.  HOW TO TAKE OC Your caregiver may advise you on how to start taking the first cycle of OCs. Otherwise, you can:  Start  on day 1 or day 5 of your menstrual period, taking the first pack of the OC. You will not need any backup contraceptive protection with this start time.   Start on the first Sunday after your menstrual period, day 7 of your menstrual period, or the day you get your prescription. In these cases, you will need backup contraceptive protection for the first cycle.  No matter which day you start the OC, you will always start a new pack on that same day of the week. It is a good idea to have an extra pack of OCs and a backup contraceptive method available, in case you miss some pills or lose your OC pack. COMMON REASONS FOR FAILURE   Forgetting to take the pill at the same time every day.   Poor absorption of the pill from the stomach into the bloodstream. This can be caused by diarrhea, vomiting, and the use of some medicines that kill germs (antibiotics).   Stomach or intestinal disease.   Taking OCs with other medicines that may make them less effective (carbamazepine, phenytoin, phenobarbital, rifampin).   Using OCs that have passed their expiration dates.   Forgetting to restart the pills on day 7, when using the packs of 21 pills.  If you forget to take 1 pill, take it as soon as you remember, and take the next   pill at the regular time. If you miss 2 or more pills, use backup birth control until your next menstrual period starts. Also, you may have vaginal spotting or bleeding when you miss 2 or more OC pills. If you use the pack of 28 pills or 91 pills, and you miss 1 of the last 7 pills (pills with no hormones), it will not matter. Just throw away the rest of the non-hormone pills and start a new 28 or 91 pill pack. COMMON USES OF OC  Decreasing premenstrual problems (symptoms).  Treating menstrual period cramps.   Avoiding becoming pregnant.   Regulating the menstrual cycle.   Treating acne.   Decreasing the heavy menstrual flow.   Treating dysfunctional (abnormal) uterine bleeding.    Treating chronic pelvic pain.   Treating polycystic ovary syndrome (ovary does not ovulate and produces tiny cysts).   Treating endometriosis (uterus lining growing in the pelvis, tubes, and ovaries).   Can be used for emergency contraception.   OCs DO NOT prevent sexually transmitted diseases (STDs). Safer sex practices, such as using condoms along with the pill, can help prevent STDs.  BENEFITS  OC reduces the risk of:   Cancer of the ovary and uterus.   Ovarian cysts.   Pelvic infection.   Symptoms of polycystic ovary syndrome.   Loss of bone (osteoporosis).   Noncancerous (benign) breast disease (fibrocystic breast changes).   Lack of red blood cells (anemia) from heavy or long menstrual periods.   Pregnancy occurring outside the uterus (tubal pregnancy).   Acne.   Slows down the flow of heavy menstrual periods.   Sometimes helps control premenstrual syndrome (PMS).   Stops menstrual cramps and pain.   Controls irregular menstrual periods.   Can be used as emergency contraception.  YOU SHOULD NOT TAKE THE PILL IF YOU:  Are pregnant, or are trying to get pregnant.  Have unexplained or abnormal vaginal bleeding.   Have a history of liver disease, stroke, or heart attack.   Smoke.   Have a history of blood clots, cancer, or heart problems.   Have gallbladder disease.   Have breast cancer or suspect breast cancer.   Have or suspect pelvic cancer.   Have high blood pressure.  Have high cholesterol or high triglycerides.   Have mental depression.   Are breast-feeding, except for the progesterone only OC, with approval of your caregiver.   Have diabetes with kidney, eye, or other blood vessel complications. Or if you have diabetes for 20 years or more.   Have heart valve disease.   Have migraine headaches. They may get worse.   Before taking the pill, a woman will have a physical exam and Pap test. Your caregiver may order blood tests to check  blood sugar and cholesterol levels, and other blood tests that may be necessary. SIDE EFFECTS OF THE PILL MAY INCLUDE:  Breast tenderness, pain and discharge.   Change in sex drive (increased or decreased libido).   Depression.   Being tired often.   Headaches.   Anxiety.   Irregular spotting or vaginal bleeding for a couple of months.  Leg pain.   Cramps, or swelling of your limbs (extremities).   Mood swings.   Weight loss or weight gain.   Feeling sick to your stomach (nausea).   Change in appetite (hunger).  Loss of hair.   Yeast or fungus vaginal infection.   Nervousness.   Rash.   Acne.   No menstrual period (amenorrhea).     When starting an OC, it is usually best to allow 2-3 months, if possible, for the body to adjust (before stopping because of side effects). This allows for adjustment to the changes in hormone levels. If a woman continues to have side effects, it may be possible to change to a different OC. It is important to discuss side effects with your caregiver. Often, changing to a different pill causes the side effects to subside. RISKS AND COMPLICATIONS  Blood clots of the leg, heart, lung, or brain.  High blood pressure.   Gallbladder disease.  Liver tumors.   Brain bleeding (hemorrhage).  Slight risk of breast cancer.   HOME CARE INSTRUCTIONS  Do not smoke.   Only take over-the-counter or prescription medicines for pain, discomfort, fever, or breast tenderness as directed by your caregiver.   Always use a condom to protect against sexually transmitted disease. OCs do not protect against STDs.   Keep a calendar, marking your menstrual period days.  Recommendations, types, and dosages of OC use change continually. Discuss your choices with your caregiver, and decide what is best for you. There are always exceptions to guidelines. You should always read the information that comes with the OC, and check whether there are any new  recommendations or guidelines. SEEK MEDICAL CARE IF:  You develop nausea and vomiting from the OC.   You have abnormal vaginal discharge.   You need treatment for headaches.   You develop a rash.   You miss your menstrual period.   You develop abnormal vaginal bleeding.   You are losing your hair.   You need treatment for mood swings or depression.   You get dizzy when taking the OC.   You develop acne from taking the OC.  SEEK IMMEDIATE MEDICAL CARE IF:  You develop leg pain.   You develop chest pain.   You develop shortness of breath.   You develop abdominal pain.   You have an uncontrolled headache.   You develop numbness or slurred speech.   You develop visual problems (loss of vision, double or blurry vision).   You develop heavy vaginal bleeding.  If you are taking the pill, STOP RIGHT AWAY and CALL YOUR CAREGIVER IMMEDIATELY if the following occur:  You develop chest pain and shortness of breath.   You develop pain, redness, and swelling in the legs.   You develop severe headaches, visual changes, or belly (abdominal) pain.   You develop severe depression.   You become pregnant.  Document Released: 04/05/2002 Document Re-Released: 02/04/2009 [employer] Patient Information 2011 [employer]. 

## 2010-11-01 NOTE — Progress Notes (Addendum)
  Subjective:    Patient ID: Emma Gardner, female    DOB: [birth year], 25 y.o.   MRN: [id number]  HPI [id number] Patient's last menstrual period was 10/18/2010.Pt has Mirena in place for over 4 years, U/S shows IUD imbedded, wants removal. Has frequent painful ovarian cysts, years ago had nausea on OCP, but would try them again if they help with cysts. Has considered tubal ligation or Essure  Past Medical History  Diagnosis Date  . Ovarian cyst   gastric ulcers,  Past Surgical History  Procedure Date  . Endometrial biopsy     normal  Gastric biopsy No Known Allergies History reviewed. No pertinent family history. No current outpatient prescriptions on file prior to visit.   Last pap nl    Review of Systems No pain or bleeding current, no discharge    Objective:   Physical Exam NAD, pleasant Abd not tender Pelvic  Cervix nl, string present. Consented for removal of IUD, time-out. Removed Mirena intact, uterus nl, no mass       Assessment & Plan:  IUD removed Pelvic pain and cysts Needs BCM Rx Generic Tricyclen Lo RTC 62mo

## 2011-01-15 ENCOUNTER — Inpatient Hospital Stay (HOSPITAL_COMMUNITY): Payer: Medicaid Other

## 2011-01-15 ENCOUNTER — Inpatient Hospital Stay (HOSPITAL_COMMUNITY)
Admission: AD | Admit: 2011-01-15 | Discharge: 2011-01-15 | Disposition: A | Discharge status: Home / Self Care | Payer: Medicaid Other | Source: Ambulatory Visit | Attending: Obstetrics & Gynecology | Admitting: Obstetrics & Gynecology

## 2011-01-15 ENCOUNTER — Encounter (HOSPITAL_COMMUNITY): Payer: Self-pay | Admitting: *Deleted

## 2011-01-15 DIAGNOSIS — R109 Unspecified abdominal pain: Secondary | ICD-10-CM

## 2011-01-15 DIAGNOSIS — N83202 Unspecified ovarian cyst, left side: Secondary | ICD-10-CM

## 2011-01-15 DIAGNOSIS — R1032 Left lower quadrant pain: Secondary | ICD-10-CM | POA: Insufficient documentation

## 2011-01-15 DIAGNOSIS — N83209 Unspecified ovarian cyst, unspecified side: Secondary | ICD-10-CM

## 2011-01-15 HISTORY — DX: Headache: R51

## 2011-01-15 HISTORY — DX: Reserved for concepts with insufficient information to code with codable children: IMO0002

## 2011-01-15 HISTORY — DX: Anxiety disorder, unspecified: F41.9

## 2011-01-15 HISTORY — DX: Unspecified abnormal cytological findings in specimens from cervix uteri: R87.619

## 2011-01-15 LAB — URINALYSIS, ROUTINE W REFLEX MICROSCOPIC
Glucose, UA: NEGATIVE mg/dL
Leukocytes, UA: NEGATIVE
Protein, ur: NEGATIVE mg/dL
Specific Gravity, Urine: 1.025 (ref 1.005–1.030)
pH: 6 (ref 5.0–8.0)

## 2011-01-15 LAB — URINE MICROSCOPIC-ADD ON

## 2011-01-15 MED ORDER — KETOROLAC TROMETHAMINE 60 MG/2ML IM SOLN
60.0000 mg | Freq: Once | INTRAMUSCULAR | Status: AC
  Administered 2011-01-15: 60 mg via INTRAMUSCULAR
  Filled 2011-01-15: qty 2

## 2011-01-15 MED ORDER — OXYCODONE-ACETAMINOPHEN 5-325 MG PO TABS
1.0000 | ORAL_TABLET | ORAL | Status: AC
  Administered 2011-01-15: 1 via ORAL
  Filled 2011-01-15: qty 1

## 2011-01-15 MED ORDER — TRAMADOL HCL ER 100 MG PO TB24: 100.0000 mg | ORAL_TABLET | Freq: Every day | ORAL | Status: DC

## 2011-01-15 MED ORDER — IBUPROFEN 600 MG PO TABS: 600.0000 mg | ORAL_TABLET | Freq: Four times a day (QID) | ORAL | Status: DC | PRN

## 2011-01-15 NOTE — ED Notes (Signed)
IUD removed in October; pt is on [facility] pill;

## 2011-01-15 NOTE — ED Provider Notes (Signed)
Attestation of Attending Supervision of Advanced Practitioner: Evaluation and management procedures were performed by the PA/NP/CNM/OB Fellow under my supervision/collaboration. Chart reviewed, and agree with management and plan.  Felicita Nuncio, M.D. 01/15/2011 2:24 PM   

## 2011-01-15 NOTE — Progress Notes (Signed)
Hx of ovariain cyst; pain started this Am on L side

## 2011-01-15 NOTE — ED Provider Notes (Signed)
History     Chief Complaint  Patient presents with  . Abdominal Pain   HPI Emma Gardner 25 y.o. had severe LLQ pain today.  Has had a ruptured ovarian cyst previously and this is the same pain.  Wants shot for pain.  Has had that before and it worked well.  Is on birth control pills.  LMP 12-24-10.  Denies vaginal discharge.  Denies dysuria.  Last seen in GYN clinic in October 2012  - Dr. Debroah Loop removed imbedded Mirena IUD and started client on pills.   OB History    Grav Para Term Preterm Abortions TAB SAB Ect Mult Living   2 2 2       2       Past Medical History  Diagnosis Date  . Ovarian cyst   . Abnormal Pap smear   . Headache   . Anxiety     Past Surgical History  Procedure Date  . Endometrial biopsy     normal  . Cervical biopsy     No family history on file.  History  Substance Use Topics  . Smoking status: Current Some Day Smoker    Types: Cigars  . Smokeless tobacco: Never Used  . Alcohol Use: No    Allergies: No Known Allergies  Prescriptions prior to admission  Medication Sig Dispense Refill  . ibuprofen (ADVIL,MOTRIN) 200 MG tablet Take 200 mg by mouth every 6 (six) hours as needed. For pain      . norethindrone-ethinyl estradiol (JUNEL FE,GILDESS FE,LOESTRIN FE) 1-20 MG-MCG tablet Take 1 tablet by mouth at bedtime.        Marland Kitchen DISCONTD: norethindrone-ethinyl estradiol (JUNEL FE 1/20) 1-20 MG-MCG tablet Take 1 tablet by mouth daily.  1 Package  11    ROS Physical Exam   Blood pressure 103/52, pulse 71, temperature 97.6 F (36.4 C), temperature source Oral, resp. rate 20, height 5\' 5"  (1.651 m), weight 184 lb (83.462 kg), last menstrual period 12/24/2010.  Physical Exam  Nursing note and vitals reviewed. Constitutional: She is oriented to person, place, and time. She appears well-developed and well-nourished.       Lying on side in bed.  Uncomfortable appearing.  HENT:  Head: Normocephalic.  Eyes: EOM are normal.  Neck: Neck supple.  GI: Soft.  There is tenderness. There is no rebound and no guarding.       Tender all across lower abdomen  Musculoskeletal: Normal range of motion.  Neurological: She is alert and oriented to person, place, and time.  Skin: Skin is warm and dry.  Psychiatric: She has a normal mood and affect.    MAU Course  Procedures Toradol 60 mg IM for pain.  On recheck, pain is now 7/10 - better but still has periodic stabbing pain which is worse. Now that pain is decreased, client able to get up to the bathroom for urine specimen. Also needed Percocet one by mouth for pain Will run GC/chlam on urine  Results for orders placed during the hospital encounter of 01/15/11 (from the past 24 hour(s))  URINALYSIS, ROUTINE W REFLEX MICROSCOPIC     Status: Abnormal   Collection Time   01/15/11 10:20 AM      Component Value Range   Color, Urine YELLOW  YELLOW    APPearance CLEAR  CLEAR    Specific Gravity, Urine 1.025  1.005 - 1.030    pH 6.0  5.0 - 8.0    Glucose, UA NEGATIVE  NEGATIVE (mg/dL)   Hgb urine  dipstick SMALL (*) NEGATIVE    Bilirubin Urine NEGATIVE  NEGATIVE    Ketones, ur NEGATIVE  NEGATIVE (mg/dL)   Protein, ur NEGATIVE  NEGATIVE (mg/dL)   Urobilinogen, UA 0.2  0.0 - 1.0 (mg/dL)   Nitrite NEGATIVE  NEGATIVE    Leukocytes, UA NEGATIVE  NEGATIVE   URINE MICROSCOPIC-ADD ON     Status: Abnormal   Collection Time   01/15/11 10:20 AM      Component Value Range   Squamous Epithelial / LPF MANY (*) RARE    RBC / HPF 0-2  <3 (RBC/hpf)  POCT PREGNANCY, URINE     Status: Normal   Collection Time   01/15/11 11:00 AM      Component Value Range   Preg Test, Ur NEGATIVE      MDM TRANSABDOMINAL AND TRANSVAGINAL ULTRASOUND OF PELVIS  Technique: Both transabdominal and transvaginal ultrasound  examinations of the pelvis were performed. Transabdominal  technique was performed for global imaging of the pelvis including  uterus, ovaries, adnexal regions, and pelvic cul-de-sac.  It was necessary to  proceed with endovaginal exam following the  transabdominal exam to visualize the endometrium and left ovarian  cystic lesion.  Comparison: 08/09/2010  Findings:  Uterus: Retroverted. Measures 8.0 x 4.5 x 4.9 cm. Normal  appearance.  Endometrium: Double layer endometrial thickness measures 6 mm  transvaginally. Normal appearance.  Right ovary: Measures 3.5 x 2.1 x 2.2 cm. Normal appearance.  Left ovary: 5.1 x 3.4 x 3.9 cm. A simple cyst seen within the  ovary measuring 4.2 x 2.6 x 3.5 cm. This has benign  characteristics.  Other Findings: A small amount of free fluid noted in cul-de-sac  and right adnexa.  IMPRESSION:  1. 4.2 cm left ovarian cyst which has benign characteristics. No  further imaging followup required in a reproductive age female.  2. Small amount of free fluid, likely physiologic.  3. Normal appearance of the uterus and right ovary.    Assessment and Plan  Left ovarian cyst Abdominal pain  Plan Will prescribe ultram for pain Can follow up in GYN clinic in 4-6 weeks  STD tests pending Continue birth control pills   BURLESON,TERRI 01/15/2011, 9:28 AM   Nolene Bernheim, NP 01/15/11 1258  Nolene Bernheim, NP 01/15/11 1303

## 2011-01-16 ENCOUNTER — Telehealth: Payer: Self-pay | Admitting: *Deleted

## 2011-01-16 DIAGNOSIS — N83209 Unspecified ovarian cyst, unspecified side: Secondary | ICD-10-CM

## 2011-01-16 LAB — GC/CHLAMYDIA PROBE AMP, URINE
Chlamydia, Swab/Urine, PCR: NEGATIVE
GC Probe Amp, Urine: NEGATIVE

## 2011-01-16 MED ORDER — HYDROCODONE-ACETAMINOPHEN 5-500 MG PO TABS: 1.0000 | ORAL_TABLET | ORAL | Status: AC | PRN

## 2011-01-16 NOTE — Telephone Encounter (Signed)
Patient states she called pharmacy and they did not have the prescription for her Vicodin.  RN verified was correct pharmacy, RN then called [facility] and spoke with Denny Peon who said prescription could not be eprescribed- that we could have a doctor fill out a prescription and fax it - Dr. Macon Large not available- RN requested Dr. Marice Potter request pt. Chart and Dr. Mont Dutton documentation- RX filled out and faxed- RN notified patient prescription could not be eprescribed , but has now been faxed and should be ready in a few hours. Pt. Voices understanding.

## 2011-01-16 NOTE — Telephone Encounter (Signed)
Pt called stated that she was seen in MAU for a cyst yesterday. She was given tramadol and ibuprofen for the pain. She was not able to get the tramadol due to cost. She wanted to know if we could prescribe percocet or vicodin. She has used both of these in the past for ovarian cysts and it seemed to work well. She is having a lot of pain and needs something other than the ibuprofen.

## 2011-01-16 NOTE — Telephone Encounter (Signed)
Called patient and notified her that Doctor has reviewed her request and rx for Vicodin sent to her pharmacy- go to MAU for worsening symptoms. Pt. Voices understanding

## 2011-01-16 NOTE — Telephone Encounter (Signed)
Dr. Macon Large- pt. Notified per your request.

## 2011-01-16 NOTE — Telephone Encounter (Signed)
Vicodin can be called in for patient for ovarian cyst pain. Order placed in EPIC.  Please advise her to return to MAU for any worsening symptoms.

## 2011-02-12 ENCOUNTER — Encounter: Payer: Medicaid Other | Admitting: Advanced Practice Midwife

## 2011-07-24 ENCOUNTER — Telehealth: Payer: Self-pay | Admitting: *Deleted

## 2011-07-24 DIAGNOSIS — Z0000 Encounter for general adult medical examination without abnormal findings: Secondary | ICD-10-CM

## 2011-07-24 NOTE — Telephone Encounter (Signed)
Message copied by Deatra James on Thu Jul 24, 2011 11:22 AM ------      Message from: Etheleen Sia      Created: Thu Jul 24, 2011  9:41 AM      Regarding: LAB       PHYSICAL LABS IN LATE AUG

## 2011-07-24 NOTE — Telephone Encounter (Signed)
Received staff msg pt made cpx for august need labs entered... 07/24/11@11 :24am/LMB

## 2011-07-28 HISTORY — PX: ESSURE TUBAL LIGATION: SUR464

## 2011-09-25 ENCOUNTER — Other Ambulatory Visit (INDEPENDENT_AMBULATORY_CARE_PROVIDER_SITE_OTHER): Payer: 59

## 2011-09-25 ENCOUNTER — Ambulatory Visit (INDEPENDENT_AMBULATORY_CARE_PROVIDER_SITE_OTHER): Payer: 59 | Admitting: Internal Medicine

## 2011-09-25 ENCOUNTER — Encounter: Payer: Self-pay | Admitting: Internal Medicine

## 2011-09-25 VITALS — BP 110/70 | HR 64 | Temp 98.4°F | Ht 66.0 in | Wt 162.1 lb

## 2011-09-25 DIAGNOSIS — Z0000 Encounter for general adult medical examination without abnormal findings: Secondary | ICD-10-CM

## 2011-09-25 LAB — LIPID PANEL
HDL: 57.5 mg/dL (ref >39.00)
LDL Cholesterol: 117 mg/dL — ABNORMAL HIGH (ref 0–99)
Total CHOL/HDL Ratio: 3
Triglycerides: 48 mg/dL (ref 0.0–149.0)
VLDL: 9.6 mg/dL (ref 0.0–40.0)

## 2011-09-25 LAB — URINALYSIS, ROUTINE W REFLEX MICROSCOPIC
Ketones, ur: NEGATIVE
Leukocytes, UA: NEGATIVE
Nitrite: NEGATIVE
Specific Gravity, Urine: >=1.03 (ref 1.000–1.030)
Urobilinogen, UA: 0.2 (ref 0.0–1.0)
pH: 6 (ref 5.0–8.0)

## 2011-09-25 LAB — CBC WITH DIFFERENTIAL/PLATELET
Basophils Absolute: 0.1 10*3/uL (ref 0.0–0.1)
Eosinophils Relative: 2.1 % (ref 0.0–5.0)
Lymphocytes Relative: 51.9 % — ABNORMAL HIGH (ref 12.0–46.0)
Monocytes Relative: 6 % (ref 3.0–12.0)
Neutrophils Relative %: 39.2 % — ABNORMAL LOW (ref 43.0–77.0)
Platelets: 328 10*3/uL (ref 150.0–400.0)
RDW: 13.5 % (ref 11.5–14.6)
WBC: 6.6 10*3/uL (ref 4.5–10.5)

## 2011-09-25 LAB — HEPATIC FUNCTION PANEL
AST: 17 U/L (ref 0–37)
Albumin: 4.3 g/dL (ref 3.5–5.2)
Alkaline Phosphatase: 26 U/L — ABNORMAL LOW (ref 39–117)
Total Protein: 7.2 g/dL (ref 6.0–8.3)

## 2011-09-25 LAB — BASIC METABOLIC PANEL
BUN: 10 mg/dL (ref 6–23)
CO2: 25 mEq/L (ref 19–32)
GFR: 75.81 mL/min (ref >60.00)
Glucose, Bld: 81 mg/dL (ref 70–99)
Potassium: 4.6 mEq/L (ref 3.5–5.1)
Sodium: 139 mEq/L (ref 135–145)

## 2011-09-25 LAB — TSH: TSH: 1.71 u[IU]/mL (ref 0.35–5.50)

## 2011-09-25 NOTE — Progress Notes (Signed)
  Subjective:    Patient ID: Emma Gardner, female    DOB: [birth year], 26 y.o.   MRN: [id number]  HPI patient is here today for annual physical. Patient feels well overall.  Past Medical History  Diagnosis Date  . Ovarian cyst   . Abnormal Pap smear   . Headache   . Anxiety   . H. pylori infection 2008   Family History  Problem Relation Age of Onset  . Dementia Paternal Grandfather   . Dementia Paternal Grandmother     History  Substance Use Topics  . Smoking status: Former Smoker    Types: Cigars    Quit date: 10/04/2010  . Smokeless tobacco: Never Used  . Alcohol Use: No    Review of Systems Constitutional: Negative for fever or weight change.  Respiratory: Negative for cough and shortness of breath.   Cardiovascular: Negative for chest pain or palpitations.  Gastrointestinal: Negative for abdominal pain, no bowel changes.  Musculoskeletal: Negative for gait problem or joint swelling.  Skin: Negative for rash.  Neurological: Negative for dizziness or headache.  No other specific complaints in a complete review of systems (except as listed in HPI above).     Objective:   Physical Exam BP 110/70  Pulse 64  Temp 98.4 F (36.9 C) (Oral)  Ht 5\' 6"  (1.676 m)  Wt 162 lb 1.9 oz (73.537 kg)  BMI 26.17 kg/m2  SpO2 98% Wt Readings from Last 3 Encounters:  09/25/11 162 lb 1.9 oz (73.537 kg)  01/15/11 184 lb (83.462 kg)  11/01/10 182 lb 1.6 oz (82.6 kg)   Constitutional: She appears well-developed and well-nourished. No distress.  HENT: Head: Normocephalic and atraumatic. Ears: B TMs ok, no erythema or effusion; Nose: Nose normal.  Mouth/Throat: Oropharynx is clear and moist. No oropharyngeal exudate.  Eyes: Conjunctivae and EOM are normal. Pupils are equal, round, and reactive to light. No scleral icterus.  Neck: Normal range of motion. Neck supple. No JVD present. No thyromegaly present.  Cardiovascular: Normal rate, regular rhythm and normal heart sounds.  No murmur  heard. No BLE edema. Pulmonary/Chest: Effort normal and breath sounds normal. No respiratory distress. She has no wheezes.  Abdominal: Soft. Bowel sounds are normal. She exhibits no distension. There is no tenderness. no masses Musculoskeletal: Normal range of motion, no joint effusions. No gross deformities Neurological: She is alert and oriented to person, place, and time. No cranial nerve deficit. Coordination normal.  Skin: Skin is warm and dry. No rash noted. No erythema.  Psychiatric: She has a normal mood and affect. Her behavior is normal. Judgment and thought content normal.   Lab Results  Component Value Date   WBC 8.4 08/09/2010   HGB 12.1 08/09/2010   HCT 36.2 08/09/2010   PLT 281 08/09/2010   GLUCOSE 84 08/09/2010   CHOL 176 01/15/2010   TRIG 69.0 01/15/2010   HDL 60.80 01/15/2010   LDLCALC 101* 01/15/2010   ALT 19 08/09/2010   AST 22 08/09/2010   NA 138 08/09/2010   K 4.1 08/09/2010   CL 104 08/09/2010   CREATININE 0.78 08/09/2010   BUN 11 08/09/2010   CO2 28 08/09/2010   TSH 1.12 01/15/2010        Assessment & Plan:  CPX/v70.0 - Patient has been counseled on age-appropriate routine health concerns for screening and prevention. These are reviewed and up-to-date. Immunizations are up-to-date or declined. Labs ordered and  reviewed.

## 2011-09-25 NOTE — Patient Instructions (Signed)
It was good to see you today. We have reviewed your prior records including labs and tests today Health Maintenance reviewed - all recommended immunizations and age-appropriate screenings are up-to-date. Test(s) ordered today. Your results will be called to you after review (48-72hours after test completion). If any changes need to be made, you will be notified at that time. Please schedule followup in 1-2 years for physical and labs, call sooner if problems.  Health Maintenance, Females A healthy lifestyle and preventative care can promote health and wellness.  Maintain regular health, dental, and eye exams.   Eat a healthy diet. Foods like vegetables, fruits, whole grains, low-fat dairy products, and lean protein foods contain the nutrients you need without too many calories. Decrease your intake of foods high in solid fats, added sugars, and salt. Get information about a proper diet from your caregiver, if necessary.   Regular physical exercise is one of the most important things you can do for your health. Most adults should get at least 150 minutes of moderate-intensity exercise (any activity that increases your heart rate and causes you to sweat) each week. In addition, most adults need muscle-strengthening exercises on 2 or more days a week.     Maintain a healthy weight. The body mass index (BMI) is a screening tool to identify possible weight problems. It provides an estimate of body fat based on height and weight. Your caregiver can help determine your BMI, and can help you achieve or maintain a healthy weight. For adults 20 years and older:   A BMI below 18.5 is considered underweight.   A BMI of 18.5 to 24.9 is normal.   A BMI of 25 to 29.9 is considered overweight.   A BMI of 30 and above is considered obese.   Maintain normal blood lipids and cholesterol by exercising and minimizing your intake of saturated fat. Eat a balanced diet with plenty of fruits and vegetables. Blood  tests for lipids and cholesterol should begin at age 40 and be repeated every 5 years. If your lipid or cholesterol levels are high, you are over 50, or you are a high risk for heart disease, you may need your cholesterol levels checked more frequently. Ongoing high lipid and cholesterol levels should be treated with medicines if diet and exercise are not effective.   If you smoke, find out from your caregiver how to quit. If you do not use tobacco, do not start.   If you are pregnant, do not drink alcohol. If you are breastfeeding, be very cautious about drinking alcohol. If you are not pregnant and choose to drink alcohol, do not exceed 1 drink per day. One drink is considered to be 12 ounces (355 mL) of beer, 5 ounces (148 mL) of wine, or 1.5 ounces (44 mL) of liquor.   Avoid use of street drugs. Do not share needles with anyone. Ask for help if you need support or instructions about stopping the use of drugs.   High blood pressure causes heart disease and increases the risk of stroke. Blood pressure should be checked at least every 1 to 2 years. Ongoing high blood pressure should be treated with medicines, if weight loss and exercise are not effective.   If you are 31 to 27 years old, ask your caregiver if you should take aspirin to prevent strokes.   Diabetes screening involves taking a blood sample to check your fasting blood sugar level. This should be done once every 3 years, after age 5,  if you are within normal weight and without risk factors for diabetes. Testing should be considered at a younger age or be carried out more frequently if you are overweight and have at least 1 risk factor for diabetes.   Breast cancer screening is essential preventative care for women. You should practice "breast self-awareness." This means understanding the normal appearance and feel of your breasts and may include breast self-examination. Any changes detected, no matter how small, should be reported to a  caregiver. Women in their 31s and 30s should have a clinical breast exam (CBE) by a caregiver as part of a regular health exam every 1 to 3 years. After age 60, women should have a CBE every year. Starting at age 60, women should consider having a mammogram (breast X-ray) every year. Women who have a family history of breast cancer should talk to their caregiver about genetic screening. Women at a high risk of breast cancer should talk to their caregiver about having an MRI and a mammogram every year.   The Pap test is a screening test for cervical cancer. Women should have a Pap test starting at age 65. Between ages 23 and 59, Pap tests should be repeated every 2 years. Beginning at age 28, you should have a Pap test every 3 years as long as the past 3 Pap tests have been normal. If you had a hysterectomy for a problem that was not cancer or a condition that could lead to cancer, then you no longer need Pap tests. If you are between ages 20 and 28, and you have had normal Pap tests going back 10 years, you no longer need Pap tests. If you have had past treatment for cervical cancer or a condition that could lead to cancer, you need Pap tests and screening for cancer for at least 20 years after your treatment. If Pap tests have been discontinued, risk factors (such as a new sexual partner) need to be reassessed to determine if screening should be resumed. Some women have medical problems that increase the chance of getting cervical cancer. In these cases, your caregiver may recommend more frequent screening and Pap tests.   The human papillomavirus (HPV) test is an additional test that may be used for cervical cancer screening. The HPV test looks for the virus that can cause the cell changes on the cervix. The cells collected during the Pap test can be tested for HPV. The HPV test could be used to screen women aged 4 years and older, and should be used in women of any age who have unclear Pap test results.  After the age of 30, women should have HPV testing at the same frequency as a Pap test.   Colorectal cancer can be detected and often prevented. Most routine colorectal cancer screening begins at the age of 74 and continues through age 18. However, your caregiver may recommend screening at an earlier age if you have risk factors for colon cancer. On a yearly basis, your caregiver may provide home test kits to check for hidden blood in the stool. Use of a small camera at the end of a tube, to directly examine the colon (sigmoidoscopy or colonoscopy), can detect the earliest forms of colorectal cancer. Talk to your caregiver about this at age 2, when routine screening begins. Direct examination of the colon should be repeated every 5 to 10 years through age 58, unless early forms of pre-cancerous polyps or small growths are found.   Hepatitis C  blood testing is recommended for all people born from [birth year] through 1966 and any individual with known risks for hepatitis C.   Practice safe sex. Use condoms and avoid high-risk sexual practices to reduce the spread of sexually transmitted infections (STIs). Sexually active women aged 2 and younger should be checked for Chlamydia, which is a common sexually transmitted infection. Older women with new or multiple partners should also be tested for Chlamydia. Testing for other STIs is recommended if you are sexually active and at increased risk.   Osteoporosis is a disease in which the bones lose minerals and strength with aging. This can result in serious bone fractures. The risk of osteoporosis can be identified using a bone density scan. Women ages 21 and over and women at risk for fractures or osteoporosis should discuss screening with their caregivers. Ask your caregiver whether you should be taking a calcium supplement or vitamin D to reduce the rate of osteoporosis.   Menopause can be associated with physical symptoms and risks. Hormone replacement therapy is  available to decrease symptoms and risks. You should talk to your caregiver about whether hormone replacement therapy is right for you.   Use sunscreen with a sun protection factor (SPF) of 30 or greater. Apply sunscreen liberally and repeatedly throughout the day. You should seek shade when your shadow is shorter than you. Protect yourself by wearing long sleeves, pants, a wide-brimmed hat, and sunglasses year round, whenever you are outdoors.   Notify your caregiver of new moles or changes in moles, especially if there is a change in shape or color. Also notify your caregiver if a mole is larger than the size of a pencil eraser.   Stay current with your immunizations.  Document Released: 07/29/2010 Document Revised: 01/02/2011 Document Reviewed: 07/29/2010 [facility] Patient Information 2012 [place], [place].

## 2011-11-05 ENCOUNTER — Other Ambulatory Visit: Payer: Self-pay | Admitting: Obstetrics & Gynecology

## 2011-11-17 ENCOUNTER — Inpatient Hospital Stay (HOSPITAL_COMMUNITY)
Admission: AD | Admit: 2011-11-17 | Discharge: 2011-11-17 | Disposition: A | Discharge status: Hospice | Payer: 59 | Source: Ambulatory Visit | Attending: Obstetrics and Gynecology | Admitting: Obstetrics and Gynecology

## 2011-11-17 ENCOUNTER — Other Ambulatory Visit (HOSPITAL_COMMUNITY): Payer: Self-pay | Admitting: Obstetrics and Gynecology

## 2011-11-17 ENCOUNTER — Encounter (HOSPITAL_COMMUNITY): Payer: Self-pay

## 2011-11-17 DIAGNOSIS — R102 Pelvic and perineal pain: Secondary | ICD-10-CM

## 2011-11-17 DIAGNOSIS — R109 Unspecified abdominal pain: Secondary | ICD-10-CM | POA: Insufficient documentation

## 2011-11-17 DIAGNOSIS — N83209 Unspecified ovarian cyst, unspecified side: Secondary | ICD-10-CM | POA: Insufficient documentation

## 2011-11-17 DIAGNOSIS — N949 Unspecified condition associated with female genital organs and menstrual cycle: Secondary | ICD-10-CM

## 2011-11-17 DIAGNOSIS — N971 Female infertility of tubal origin: Secondary | ICD-10-CM

## 2011-11-17 LAB — URINALYSIS, ROUTINE W REFLEX MICROSCOPIC
Bilirubin Urine: NEGATIVE
Ketones, ur: NEGATIVE mg/dL
Leukocytes, UA: NEGATIVE
Nitrite: NEGATIVE
Protein, ur: NEGATIVE mg/dL
Urobilinogen, UA: 0.2 mg/dL (ref 0.0–1.0)

## 2011-11-17 MED ORDER — KETOROLAC TROMETHAMINE 60 MG/2ML IM SOLN
60.0000 mg | Freq: Once | INTRAMUSCULAR | Status: AC
  Administered 2011-11-17: 60 mg via INTRAMUSCULAR
  Filled 2011-11-17: qty 2

## 2011-11-17 NOTE — MAU Provider Note (Signed)
History     CSN: [id number]  Arrival date and time: 11/17/11 1216   First Provider Initiated Contact with Patient 11/17/11 1313      Chief Complaint  Patient presents with  . Abdominal Pain   HPI Emma Gardner is 26 y.o. G2P2002 presents with ? Ovarian cyst.  She is a patient of Dr. Senaida Ores. Hx of Essure attempt in July, states the right coil went in without a problem, much difficulty with the left.  She has appt for dye study to evaluate fallopian tubes in 2 days.  Called office about her pain on her left side now going across the lower abdomen X 2weeks.  They suggested she take medication for her discomfort and to continue OCPs daily.   She is concerned because she has hx of ovarian cysts.  States this pain has lasted longer than pain associated with cyst.  1 partner X 8 years.  LMP  7/13,   Was given Depo in July when Essure not successful and began OCPs last week.      Past Medical History  Diagnosis Date  . Ovarian cyst   . Abnormal Pap smear   . Headache   . Anxiety   . H. pylori infection 2008    Past Surgical History  Procedure Date  . Endometrial biopsy     normal  . Cervical biopsy   . Essure tubal ligation 07/2011    Family History  Problem Relation Age of Onset  . Dementia Paternal Grandfather   . Dementia Paternal Grandmother     History  Substance Use Topics  . Smoking status: Former Smoker    Types: Cigars    Quit date: 10/04/2010  . Smokeless tobacco: Never Used  . Alcohol Use: No    Allergies: No Known Allergies  Prescriptions prior to admission  Medication Sig Dispense Refill  . MICROGESTIN FE 1/20 1-20 MG-MCG tablet TAKE ONE TABLET BY MOUTH EVERY DAY  1 each  10    Review of Systems  Constitutional: Negative.   Gastrointestinal: Positive for abdominal pain. Negative for nausea, vomiting and diarrhea.  Genitourinary: Negative.    Physical Exam   Blood pressure 130/79, pulse 78, temperature 98.2 F (36.8 C), temperature source Oral,  resp. rate 16, height 5\' 5"  (1.651 m), weight 73.392 kg (161 lb 12.8 oz), SpO2 100.00%.  Physical Exam  Constitutional: She is oriented to person, place, and time. She appears well-developed and well-nourished. No distress.  HENT:  Head: Normocephalic.  Neck: Normal range of motion.  Cardiovascular: Normal rate.   Respiratory: Effort normal.  GI: Soft. She exhibits no distension and no mass. There is tenderness (mild on the left.  Neg on the right). There is no rebound and no guarding.  Genitourinary: Uterus is not enlarged and not tender. Cervix exhibits no motion tenderness and no discharge. Right adnexum displays no mass, no tenderness and no fullness. Left adnexum displays tenderness (mild). Left adnexum displays no mass and no fullness. No erythema, tenderness or bleeding around the vagina. No vaginal discharge found.  Neurological: She is alert and oriented to person, place, and time.  Skin: Skin is warm and dry.  Psychiatric: She has a normal mood and affect. Her behavior is normal.    MAU Course  Procedures  MDM 13:30  MSE reported to Dr. Ellyn Hack.  Order given for Pelvic ultrasound and toradol 60mg  IM.  Dr. Ellyn Hack called back to report that the patient has an ultrasound scheduled in the office tomorrow at 9:30am.  I was asked to tell patient and to tell her she needed to bring money.   ALso order for wet prep with vaginal discharge present and GC/CHL if patient feels it is needed.   Patient states she and her sexual partner are monogamous.  The vagina was clear without discharge or odor so wet prep was not done.  I informed patient of the ultrasound appt for tomorrow in the office at 9:30 and that she needed to bring $68.00 per Dr. Ellyn Hack.  Toradol 60mg  IM ordered. Dr. Ellyn Hack called at 14:30 checking on patient.  Reported toradol has been given waiting to re-evaluate.  Informed Gc/CHL and wetprep not done.    Patient states she is feeling better.  Ready for discharge.  Needs note for  work  Assessment and Plan  A:  Left pelvic pain     Hx of ovarian cyst  P:  Keep appt in the office tomorrow at 9:30 for ultrasound.      Patient understands she need to bring money required.     Instructed not to take any more ibuprofen/aleve today.  May add tylenol if needed Note given for work  [name],EVE M 11/17/2011, 1:13 PM

## 2011-11-17 NOTE — MAU Note (Signed)
Patient states she has a history of ovarian cyst. States she had Esure placed in July but could only get into the right side.Was given Depo on that day. Started on BCP's last week.  Has been having pain for about 2 weeks. Denies any bleeding, some increase in discharge.

## 2011-11-17 NOTE — MAU Note (Signed)
Pt states Essure placed in July via Dr. Senaida Ores, were only able to place on right side, which does not hurt today. Pain on left side is constant. Hx ovarian cysts. Constant pain x2 weeks. Noted clear-white vaginal discharge x2 days.

## 2011-11-19 ENCOUNTER — Ambulatory Visit (HOSPITAL_COMMUNITY)
Admission: RE | Admit: 2011-11-19 | Discharge: 2011-11-19 | Disposition: A | Discharge status: Home / Self Care | Payer: 59 | Source: Ambulatory Visit | Attending: Obstetrics and Gynecology | Admitting: Obstetrics and Gynecology

## 2011-11-19 DIAGNOSIS — Z3049 Encounter for surveillance of other contraceptives: Secondary | ICD-10-CM | POA: Insufficient documentation

## 2011-11-19 DIAGNOSIS — N971 Female infertility of tubal origin: Secondary | ICD-10-CM

## 2011-11-19 MED ORDER — IOHEXOL 300 MG/ML  SOLN
10.0000 mL | Freq: Once | INTRAMUSCULAR | Status: AC | PRN
  Administered 2011-11-19: 10 mL

## 2012-03-05 ENCOUNTER — Ambulatory Visit: Payer: 59

## 2012-03-05 ENCOUNTER — Ambulatory Visit (INDEPENDENT_AMBULATORY_CARE_PROVIDER_SITE_OTHER): Payer: 59 | Admitting: Internal Medicine

## 2012-03-05 ENCOUNTER — Encounter: Payer: Self-pay | Admitting: Internal Medicine

## 2012-03-05 VITALS — BP 112/72 | HR 81 | Temp 98.3°F | Wt 174.6 lb

## 2012-03-05 DIAGNOSIS — R110 Nausea: Secondary | ICD-10-CM

## 2012-03-05 DIAGNOSIS — R894 Abnormal immunological findings in specimens from other organs, systems and tissues: Secondary | ICD-10-CM

## 2012-03-05 DIAGNOSIS — G44209 Tension-type headache, unspecified, not intractable: Secondary | ICD-10-CM

## 2012-03-05 DIAGNOSIS — R768 Other specified abnormal immunological findings in serum: Secondary | ICD-10-CM

## 2012-03-05 MED ORDER — FAMOTIDINE 20 MG PO TABS: 20.0000 mg | ORAL_TABLET | Freq: Two times a day (BID) | ORAL | Status: DC

## 2012-03-05 MED ORDER — BUTALBITAL-APAP-CAFFEINE 50-325-40 MG PO TABS: 1.0000 | ORAL_TABLET | Freq: Two times a day (BID) | ORAL | Status: DC | PRN

## 2012-03-05 NOTE — Progress Notes (Signed)
Subjective:    Patient ID: Emma Gardner, female    DOB: [birth year], 27 y.o.   MRN: [id number]  HPI Here for nausea x3 weeks, not associated with abdominal pain or vomiting Exacerbated by meals, no bowel changes or fever Hormone injection greater than 30 days ago, no other medication changes Current symptoms similar to H. pylori symptoms in 2008  Also complains of headache x1 week Located right temporal side and occipital region Symptoms improved with Tylenol, but recurred daily Not associated with vision changes, history of head trauma, weakness, numbness or seizures No history of same Attributes increased work stressors in recent weeks  Past Medical History  Diagnosis Date  . Ovarian cyst   . Abnormal Pap smear   . Headache   . Anxiety   . H. pylori infection 2008    Review of Systems  Constitutional: Negative for fever and fatigue.  HENT: Negative for facial swelling, neck pain and ear discharge.   Eyes: Negative for photophobia and pain.  Respiratory: Negative for cough and shortness of breath.   Cardiovascular: Negative for chest pain and palpitations.  Gastrointestinal: Positive for nausea. Negative for vomiting, abdominal pain, diarrhea, constipation, blood in stool and abdominal distention.  Genitourinary: Negative for urgency and pelvic pain.  Psychiatric/Behavioral: Negative for dysphoric mood. The patient is not nervous/anxious.        Objective:   Physical Exam BP 112/72  Pulse 81  Temp 98.3 F (36.8 C) (Oral)  Wt 174 lb 9.6 oz (79.198 kg)  SpO2 97% Wt Readings from Last 3 Encounters:  03/05/12 174 lb 9.6 oz (79.198 kg)  11/17/11 161 lb 12.8 oz (73.392 kg)  09/25/11 162 lb 1.9 oz (73.537 kg)   Constitutional: She appears well-developed and well-nourished. No distress.  Neck: Normal range of motion. Neck supple. No JVD present. No thyromegaly present.  Cardiovascular: Normal rate, regular rhythm and normal heart sounds.  No murmur heard. No BLE  edema. Pulmonary/Chest: Effort normal and breath sounds normal. No respiratory distress. She has no wheezes.  Abdominal: Soft. Bowel sounds are normal. She exhibits no distension. There is no tenderness. no masses Neurological: She is alert and oriented to person, place, and time. No cranial nerve deficit. Coordination normal.  Skin: Skin is warm and dry. No rash noted. No erythema.  Psychiatric: She has a normal mood and affect. Her behavior is normal. Judgment and thought content normal.   Lab Results  Component Value Date   WBC 6.6 09/25/2011   HGB 12.5 09/25/2011   HCT 37.7 09/25/2011   PLT 328.0 09/25/2011   GLUCOSE 81 09/25/2011   CHOL 184 09/25/2011   TRIG 48.0 09/25/2011   HDL 57.50 09/25/2011   LDLCALC 117* 09/25/2011   ALT 16 09/25/2011   AST 17 09/25/2011   NA 139 09/25/2011   K 4.6 09/25/2011   CL 106 09/25/2011   CREATININE 0.9 09/25/2011   BUN 10 09/25/2011   CO2 25 09/25/2011   TSH 1.71 09/25/2011      Assessment & Plan:   Nausea - postprandial x 3 weeks - no red flags in history or exam - recent gyn eval unremarkable, last hormone injection >30d ago -history of H. pylori 2008, status post antibiotic eradication therapy - recheck H. pylori antibody now - begin tx with H2 blocker twice a day for the next 2 weeks, then as needed -further evaluation to depend on response to therapy and test results -reassurance provided  Right-sided tension headache. Again history and exam benign. Will treat  with Esgic when necessary pain. Education provided on stress reduction -pt to call if symptoms worse or unimproved

## 2012-03-05 NOTE — Patient Instructions (Signed)
It was good to see you today. Test(s) ordered today. Your results will be released to MyChart (or called to you) after review, usually within 72hours after test completion. If any changes need to be made, you will be notified at that same time. Start Pepcid twice a day for the next 2 weeks, then as needed for nausea and stomach symptoms Use Esgic as needed for headache symptoms Your prescription(s) have been submitted to your pharmacy. Please take as directed and contact our office if you believe you are having problem(s) with the medication(s). Please call us if symptoms are worse or unimproved in the next 2 weeks for further evaluation as needed Tension Headache A tension headache is a feeling of pain, pressure, or aching often felt over the front and sides of the head. The pain can be dull or can feel tight (constricting). It is the most common type of headache. Tension headaches are not normally associated with nausea or vomiting and do not get worse with physical activity. Tension headaches can last 30 minutes to several days.   CAUSES   The exact cause is not known, but it may be caused by chemicals and hormones in the brain that lead to pain. Tension headaches often begin after stress, anxiety, or depression. Other triggers may include:  Alcohol.   Caffeine (too much or withdrawal).   Respiratory infections (colds, flu, sinus infections).   Dental problems or teeth clenching.   Fatigue.   Holding your head and neck in one position too long while using a computer.  SYMPTOMS    Pressure around the head.     Dull, aching head pain.     Pain felt over the front and sides of the head.     Tenderness in the muscles of the head, neck, and shoulders.  DIAGNOSIS   A tension headache is often diagnosed based on:    Symptoms.     Physical examination.     A CT scan or MRI of your head. These tests may be ordered if symptoms are severe or unusual.  TREATMENT   Medicines may be  given to help relieve symptoms.   HOME CARE INSTRUCTIONS    Only take over-the-counter or prescription medicines for pain or discomfort as directed by your caregiver.     Lie down in a dark, quiet room when you have a headache.     Keep a journal to find out what may be triggering your headaches. For example, write down:   What you eat and drink.   How much sleep you get.   Any change to your diet or medicines.   Try massage or other relaxation techniques.     Ice packs or heat applied to the head and neck can be used. Use these 3 to 4 times per day for 15 to 20 minutes each time, or as needed.     Limit stress.     Sit up straight, and do not tense your muscles.     Quit smoking if you smoke.   Limit alcohol use.   Decrease the amount of caffeine you drink, or stop drinking caffeine.   Eat and exercise regularly.   Get 7 to 9 hours of sleep, or as recommended by your caregiver.   Avoid excessive use of pain medicine as recurrent headaches can occur.     SEEK MEDICAL CARE IF:    You have problems with the medicines you were prescribed.   Your medicines do not work.  You have a change from the usual headache.   You have nausea or vomiting.  SEEK IMMEDIATE MEDICAL CARE IF:    Your headache becomes severe.   You have a fever.   You have a stiff neck.   You have loss of vision.   You have muscular weakness or loss of muscle control.   You lose your balance or have trouble walking.   You feel faint or pass out.   You have severe symptoms that are different from your first symptoms.  MAKE SURE YOU:    Understand these instructions.   Will watch your condition.   Will get help right away if you are not doing well or get worse.  Document Released: 01/13/2005 Document Revised: 04/07/2011 Document Reviewed: 01/03/2011 [facility] Patient Information 2013 [place], [place].

## 2012-03-09 ENCOUNTER — Telehealth: Payer: Self-pay | Admitting: *Deleted

## 2012-03-09 DIAGNOSIS — R110 Nausea: Secondary | ICD-10-CM

## 2012-03-09 DIAGNOSIS — R768 Other specified abnormal immunological findings in serum: Secondary | ICD-10-CM

## 2012-03-09 NOTE — Telephone Encounter (Signed)
Notified pt with md response. Inform pt will received call bck with referral appt once been set-up by [facility]...Raechel Chute

## 2012-03-09 NOTE — Telephone Encounter (Signed)
I doubt the infection is "still in her system" - the antibody will remain positive with PRIOR infection, even 3-5 years ago -  Because of symptoms and questions, will refer to GI for eval and tx for same - No tx changes recommended by me at this time pending GI eval

## 2012-03-09 NOTE — Telephone Encounter (Signed)
Called pt concerning labs. Pt is concern about H. Pylori still her system. She stated she had that about 3-4 years ago. She states she is still nauseous in the am & every time she eats. Wanting to know what she can do to get infection out of her system. Should she take another antibiotic that was rx before...Raechel Chute

## 2012-03-15 ENCOUNTER — Telehealth: Payer: Self-pay | Admitting: Internal Medicine

## 2012-03-15 NOTE — Telephone Encounter (Signed)
Caller: Genene/Patient; Phone: [phone]; Reason for Call: Pt was seen in the office on 03/05/12 for headache.  Pt was given medication (Esgic) but it is not helping.  Pt wants to come back in to talk with doctor about medicaion, FMLA and gastro referral (Dr Christella Hartigan).  Pt denied any retriage, stating symptoms are the same not changed from office visit. Appt scheduled for 1100 on 03/16/12 with Dr Felicity Coyer

## 2012-03-16 ENCOUNTER — Encounter: Payer: Self-pay | Admitting: Internal Medicine

## 2012-03-16 ENCOUNTER — Ambulatory Visit (INDEPENDENT_AMBULATORY_CARE_PROVIDER_SITE_OTHER): Payer: 59 | Admitting: Internal Medicine

## 2012-03-16 VITALS — BP 110/80 | HR 75 | Temp 98.5°F

## 2012-03-16 DIAGNOSIS — R110 Nausea: Secondary | ICD-10-CM

## 2012-03-16 DIAGNOSIS — Z733 Stress, not elsewhere classified: Secondary | ICD-10-CM

## 2012-03-16 DIAGNOSIS — F439 Reaction to severe stress, unspecified: Secondary | ICD-10-CM

## 2012-03-16 DIAGNOSIS — R51 Headache: Secondary | ICD-10-CM

## 2012-03-16 MED ORDER — METOCLOPRAMIDE HCL 5 MG PO TABS: 5.0000 mg | ORAL_TABLET | Freq: Three times a day (TID) | ORAL | Status: DC

## 2012-03-16 MED ORDER — TRAMADOL HCL 50 MG PO TABS: 50.0000 mg | ORAL_TABLET | Freq: Four times a day (QID) | ORAL | Status: DC | PRN

## 2012-03-16 NOTE — Patient Instructions (Signed)
It was good to see you today. we'll make referral for MRI/MRA brain. Our office will contact you regarding appointment(s) once made.. Your results will be released to MyChart (or called to you) after review, usually within 72hours after test completion. If any changes need to be made, you will be notified at that same time. Start reglan for nausea 3x/day with meals Use tramadol as needed for headache pain Your prescription(s) have been submitted to your pharmacy. Please take as directed and contact our office if you believe you are having problem(s) with the medication(s). Keep follow up with Dr Christella Hartigan as planned Bring the FMLA work to our office and I will complete for you as discussed

## 2012-03-16 NOTE — Progress Notes (Signed)
Subjective:    Patient ID: Emma Gardner, female    DOB: August 09, 1985, 27 y.o.   MRN: [id number]  Headache  This is a recurrent problem. The current episode started 1 to 4 weeks ago. The problem occurs daily. The problem has been unchanged. The pain is located in the right unilateral region. The pain does not radiate. The pain quality is not similar to prior headaches. The quality of the pain is described as boring, aching, sharp, squeezing and throbbing. The pain is moderate. Associated symptoms include anorexia, dizziness, nausea and numbness. Pertinent negatives include no abdominal pain, coughing, drainage, ear pain, eye pain, fever, hearing loss, neck pain, phonophobia, photophobia, scalp tenderness, seizures, sinus pressure, tinnitus, visual change or vomiting. The symptoms are aggravated by work and emotional stress. She has tried acetaminophen, NSAIDs and darkened room for the symptoms. The treatment provided mild relief. There is no history of cancer, hypertension, migraine headaches, obesity, recent head traumas, sinus disease or TMJ.   Also continued nausea x4 weeks, not associated with abdominal pain or vomiting Exacerbated by meals, no bowel changes or fever Hormone injection greater than 30 days ago, no other medication changes Current symptoms similar to H. pylori symptoms in 2008 Pending GI eval for same 03/28/12   Past Medical History  Diagnosis Date  . Ovarian cyst   . Abnormal Pap smear   . Headache   . Anxiety   . H. pylori infection 2008    Review of Systems  Constitutional: Negative for fever and fatigue.  HENT: Negative for hearing loss, ear pain, facial swelling, neck pain, sinus pressure, tinnitus and ear discharge.   Eyes: Negative for photophobia and pain.  Respiratory: Negative for cough and shortness of breath.   Cardiovascular: Negative for chest pain and palpitations.  Gastrointestinal: Positive for nausea and anorexia. Negative for vomiting, abdominal pain,  diarrhea, constipation, blood in stool and abdominal distention.  Genitourinary: Negative for urgency and pelvic pain.  Neurological: Positive for dizziness, numbness and headaches. Negative for seizures.  Psychiatric/Behavioral: Negative for dysphoric mood. The patient is not nervous/anxious.        Objective:   Physical Exam  BP 110/80  Pulse 75  Temp(Src) 98.5 F (36.9 C) (Oral)  SpO2 95% Wt Readings from Last 3 Encounters:  03/05/12 174 lb 9.6 oz (79.198 kg)  11/17/11 161 lb 12.8 oz (73.392 kg)  09/25/11 162 lb 1.9 oz (73.537 kg)   Constitutional: She appears fatigued, but well-developed and well-nourished. No distress.  Neck: Normal range of motion. Neck supple. No JVD present. No thyromegaly present.  Cardiovascular: Normal rate, regular rhythm and normal heart sounds.  No murmur heard. No BLE edema. Pulmonary/Chest: Effort normal and breath sounds normal. No respiratory distress. She has no wheezes.  Abdominal: Soft. Bowel sounds are normal. She exhibits no distension. There is no tenderness. no masses Neurological: She is alert and oriented to person, place, and time. No cranial nerve deficit. Coordination, balance and speech normal. Neg Romberg  Psychiatric: She has a dysphoric and anxious mood and tearful affect. Her behavior is normal. Judgment and thought content normal.   Lab Results  Component Value Date   WBC 6.6 09/25/2011   HGB 12.5 09/25/2011   HCT 37.7 09/25/2011   PLT 328.0 09/25/2011   GLUCOSE 81 09/25/2011   CHOL 184 09/25/2011   TRIG 48.0 09/25/2011   HDL 57.50 09/25/2011   LDLCALC 117* 09/25/2011   ALT 16 09/25/2011   AST 17 09/25/2011   NA 139 09/25/2011  K 4.6 09/25/2011   CL 106 09/25/2011   CREATININE 0.9 09/25/2011   BUN 10 09/25/2011   CO2 25 09/25/2011   TSH 1.71 09/25/2011      Assessment & Plan:   See problem list. Medications and labs reviewed today.

## 2012-03-17 ENCOUNTER — Encounter: Payer: Self-pay | Admitting: Internal Medicine

## 2012-03-17 DIAGNOSIS — F439 Reaction to severe stress, unspecified: Secondary | ICD-10-CM | POA: Insufficient documentation

## 2012-03-17 DIAGNOSIS — R110 Nausea: Secondary | ICD-10-CM | POA: Insufficient documentation

## 2012-03-17 NOTE — Assessment & Plan Note (Signed)
Right-sided headache. Ongoing >3 weeks, daily. Again neuro exam benign. Suspect exacerbated by emotional stress/depression - pt unhappy at work, tearful when discussing same. Will check MRI/MRA brain given chronicity of symptoms - Ineffective pain relief with OTC tylenol or Esgic trials - ok for tramadol when necessary (new erx) or ibuprofen. Education provided on stress reduction and contribution of suspected clinical depression-pt agrees to call if symptoms worse or unimproved

## 2012-03-17 NOTE — Assessment & Plan Note (Signed)
Nausea, daily > 4 weeks - Initially postprandial, now constant - Occ vomiting reported, but no red flags on exam - recent gyn eval unremarkable, last hormone injection >30d ago for tx of "ovarian cyst"  -remains concerned due to history of H. pylori 2008, status post antibiotic eradication therapy - recheck H. pylori antibody last week remains positive, pending GI eval for same, scheduled 3 weeks - begin tx with reglan TID, continued H2 blocker twice a day

## 2012-03-17 NOTE — Assessment & Plan Note (Signed)
Depression - strong FH same, but no personal hx same; ongoing symptoms approximately 4 weeks. Pt accepting of possible dx and understanding of relationship between headache, nausea and probable depression, but does not wish to start antidepressant at this time. Educated on depression, possible tx options, and stress mgmt. Verified no SI/HI - Pt agrees to call if symptoms worse or unimproved, esp if other dx eval for headache and nausea unremarkable.

## 2012-03-22 ENCOUNTER — Ambulatory Visit
Admission: RE | Admit: 2012-03-22 | Discharge: 2012-03-22 | Disposition: A | Discharge status: Home / Self Care | Payer: 59 | Source: Ambulatory Visit | Attending: Internal Medicine | Admitting: Internal Medicine

## 2012-03-22 DIAGNOSIS — R51 Headache: Secondary | ICD-10-CM

## 2012-03-22 MED ORDER — GADOBENATE DIMEGLUMINE 529 MG/ML IV SOLN
16.0000 mL | Freq: Once | INTRAVENOUS | Status: AC | PRN
  Administered 2012-03-22: 16 mL via INTRAVENOUS

## 2012-03-25 ENCOUNTER — Telehealth: Payer: Self-pay | Admitting: Internal Medicine

## 2012-03-25 DIAGNOSIS — Z0279 Encounter for issue of other medical certificate: Secondary | ICD-10-CM

## 2012-03-25 NOTE — Telephone Encounter (Signed)
Use OTC ibuprofen 400mg  (2-200mg  tabs) every 4 h prn headache -OV if worse

## 2012-03-25 NOTE — Telephone Encounter (Signed)
Patient Information:  Caller Name: Natha  Phone: [phone]  Patient: Emma Gardner  Gender: Female  DOB: May 30, 1985  Age: 27 Years  PCP: Rene Paci (Adults only)  Pregnant: No  Office Follow Up:  Does the office need to follow up with this patient?: Yes  Instructions For The Office: Please follow up with pt regarding HA.  Thank you.  RN Note:  Send not High Priority for office to f/u.  Symptoms  Reason For Call & Symptoms: Pt woke up with a headache.  Feels like heart is heart is racing, denies irregular beats.   Light sensitivity, seeing floaters.  No n/v. Afebrile.  Took medication as prescribed.  Pt states Tramadol make her feel funny.    Reviewed Health History In EMR: Yes  Reviewed Medications In EMR: Yes  Reviewed Allergies In EMR: Yes  Reviewed Surgeries / Procedures: Yes  Date of Onset of Symptoms: 03/23/2012  Treatments Tried: Prescribed medication  Treatments Tried Worked: No OB / GYN:  LMP: Unknown  Guideline(s) Used:  Headache  Disposition Per Guideline:   Callback by PCP or Subspecialist within 1 Hour  Reason For Disposition Reached:   Severe headache and not relieved by pain meds  Advice Given:  Reassurance - Migraine Headache:  You have told me that this headache is similar to previous migraine headaches that you have had. If the pattern or severity of your headache changes, you will need to see your physician.  Migraine Medication:   If your doctor has prescribed specific medication for your migraine, take it as directed as soon as the migraine starts.  Rest:   Lie down in a dark, quiet place and try to relax. Close your eyes and imagine your entire body relaxing.  Apply Cold to the Area:   Apply a cold wet washcloth or cold pack to the forehead for 20 minutes.  Call Back If:  You become worse.

## 2012-03-25 NOTE — Telephone Encounter (Signed)
Notified pt with md response.../lmb 

## 2012-03-30 ENCOUNTER — Ambulatory Visit (INDEPENDENT_AMBULATORY_CARE_PROVIDER_SITE_OTHER): Payer: 59 | Admitting: Gastroenterology

## 2012-03-30 ENCOUNTER — Encounter: Payer: Self-pay | Admitting: Gastroenterology

## 2012-03-30 VITALS — BP 100/60 | HR 64 | Ht 65.0 in | Wt 176.0 lb

## 2012-03-30 DIAGNOSIS — R112 Nausea with vomiting, unspecified: Secondary | ICD-10-CM

## 2012-03-30 MED ORDER — BIS SUBCIT-METRONID-TETRACYC 140-125-125 MG PO CAPS: 3.0000 | ORAL_CAPSULE | Freq: Three times a day (TID) | ORAL | Status: DC

## 2012-03-30 NOTE — Progress Notes (Signed)
Review of pertinent gastrointestinal problems: 1. Nausea, vomiting: EGD 06/2008 ([facility]) found H. Pylori + gastritis.  Symptoms improved with prev pac, but returned several months later and was given pylera.  Again symptoms improved then returned several weeks later.  H. Pylori stool Ag was negative.  2011 GES study was normal.   HPI: This is a very pleasant 27 year old woman whom I last saw about 4 years ago.    Labs last month, H. Pylori Ab was strongly +  Nausea returned 4 months ago.  Vomiting in AM.  She is definitely NOT pregnant.  She has had no significant abdominal pains.    Review of systems: Pertinent positive and negative review of systems were noted in the above HPI section. Complete review of systems was performed and was otherwise normal.    Past Medical History  Diagnosis Date  . Ovarian cyst   . Abnormal Pap smear   . Headache   . Anxiety   . H. pylori infection 2008    Past Surgical History  Procedure Laterality Date  . Endometrial biopsy      normal  . Cervical biopsy    . Essure tubal ligation  07/2011    Current Outpatient Prescriptions  Medication Sig Dispense Refill  . butalbital-acetaminophen-caffeine (ESGIC) 50-325-40 MG per tablet Take 1 tablet by mouth 2 (two) times daily as needed for headache.  40 tablet  0  . famotidine (PEPCID) 20 MG tablet Take 1 tablet (20 mg total) by mouth 2 (two) times daily.  60 tablet  1  . metoCLOPramide (REGLAN) 5 MG tablet Take 1 tablet (5 mg total) by mouth 3 (three) times daily with meals.  90 tablet  0  . MICROGESTIN FE 1/20 1-20 MG-MCG tablet TAKE ONE TABLET BY MOUTH EVERY DAY  1 each  10  . traMADol (ULTRAM) 50 MG tablet Take 1 tablet (50 mg total) by mouth every 6 (six) hours as needed for pain.  30 tablet  0   No current facility-administered medications for this visit.    Allergies as of 03/30/2012  . (No Known Allergies)    Family History  Problem Relation Age of Onset  . Dementia Paternal  Grandfather   . Dementia Paternal Grandmother   . Depression Mother   . Depression Maternal Grandmother     History   Social History  . Marital Status: Married    Spouse Name: N/A    Number of Children: N/A  . Years of Education: N/A   Occupational History  . Not on file.   Social History Main Topics  . Smoking status: Former Smoker    Types: Cigars    Quit date: 10/04/2010  . Smokeless tobacco: Never Used  . Alcohol Use: No  . Drug Use: No  . Sexually Active: Yes    Birth Control/ Protection: Surgical, Pill   Other Topics Concern  . Not on file   Social History Narrative  . No narrative on file       Physical Exam: BP 100/60  Pulse 64  Ht 5\' 5"  (1.651 m)  Wt 176 lb (79.833 kg)  BMI 29.29 kg/m2 Constitutional: generally well-appearing Psychiatric: alert and oriented x3 Eyes: extraocular movements intact Mouth: oral pharynx moist, no lesions Neck: supple no lymphadenopathy Cardiovascular: heart regular rate and rhythm Lungs: clear to auscultation bilaterally Abdomen: soft, nontender, nondistended, no obvious ascites, no peritoneal signs, normal bowel sounds Extremities: no lower extremity edema bilaterally Skin: no lesions on visible extremities    Assessment  and plan: 27 y.o. female with  recurrent H. 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 pylori. It does seem like she is presenting the same way as when she was initially diagnosed with H. pylori 4 years ago. She admitted to me today that she did not complete her full course of pylera and perhaps that is playing a role here. I am re\re prescribing that same therapy, she knows to continue the entire course of it and she will call to report on her symptoms in 3-4 weeks.

## 2012-03-30 NOTE — Patient Instructions (Addendum)
Full course of pylera again.  Take all of this medicine. Call to report on your symptoms in 3-4 weeks.  If you are not improved, then likely repeat EGD.

## 2012-04-05 ENCOUNTER — Telehealth: Payer: Self-pay

## 2012-04-05 NOTE — Telephone Encounter (Signed)
Pt called requesting an extension to her FMLA paperwork for longer than 1 day/week. Please advise

## 2012-04-05 NOTE — Telephone Encounter (Signed)
What does pt need it to say -

## 2012-04-06 NOTE — Telephone Encounter (Signed)
Noted - thanks - please be sure pt knows same (if not already done)

## 2012-04-06 NOTE — Telephone Encounter (Signed)
Reviewed FMLA forms.  The patient's fmla papers were written to provide her intermittent fmla for when the patient experiences symptoms.  She also written for absences to be excused from 02/11/12-04/13/12 due to this condition.  According to the paperwork, the fmla covers her desired time to be out.

## 2012-04-07 NOTE — Telephone Encounter (Signed)
Spoke with patient regarding her FMLA papers.  FMLA forms were updated to reflect the specific days patient was out of work, to satisfy her employer's request for more specific information.  The forms were re-faxed and patient was notified.  The updated copy of the FMLA forms were sent to batch scan.

## 2012-04-23 ENCOUNTER — Ambulatory Visit (INDEPENDENT_AMBULATORY_CARE_PROVIDER_SITE_OTHER): Payer: 59 | Admitting: Internal Medicine

## 2012-04-23 ENCOUNTER — Encounter: Payer: Self-pay | Admitting: Internal Medicine

## 2012-04-23 ENCOUNTER — Telehealth: Payer: Self-pay | Admitting: Internal Medicine

## 2012-04-23 VITALS — BP 120/60 | HR 61 | Temp 97.7°F | Resp 12 | Ht 66.0 in | Wt 173.8 lb

## 2012-04-23 DIAGNOSIS — G43909 Migraine, unspecified, not intractable, without status migrainosus: Secondary | ICD-10-CM

## 2012-04-23 DIAGNOSIS — J0110 Acute frontal sinusitis, unspecified: Secondary | ICD-10-CM

## 2012-04-23 MED ORDER — AZITHROMYCIN 250 MG PO TABS: ORAL_TABLET | ORAL | Status: DC

## 2012-04-23 MED ORDER — KETOROLAC TROMETHAMINE 30 MG/ML IJ SOLN
30.0000 mg | Freq: Once | INTRAMUSCULAR | Status: AC
  Administered 2012-04-23: 30 mg via INTRAVENOUS

## 2012-04-23 MED ORDER — FLUTICASONE PROPIONATE 50 MCG/ACT NA SUSP: 2.0000 | Freq: Every day | NASAL | Status: DC

## 2012-04-23 NOTE — Progress Notes (Signed)
Subjective:    Patient ID: Emma Gardner, female    DOB: Dec 27, 1985, 27 y.o.   MRN: [id number]  Sinus Problem This is a new problem. The current episode started in the past 7 days. The problem has been gradually worsening since onset. The maximum temperature recorded prior to her arrival was 100 - 100.9 F. The fever has been present for less than 1 day. The pain is moderate. Associated symptoms include congestion, ear pain, headaches, sinus pressure, sneezing, a sore throat and swollen glands. Pertinent negatives include no coughing, diaphoresis, hoarse voice, neck pain or shortness of breath. Past treatments include acetaminophen. The treatment provided no relief.  Migraine  This is a chronic problem. The current episode started yesterday. The problem occurs constantly. The problem has been gradually improving. The pain is located in the frontal and bilateral region. The pain does not radiate. The pain quality is similar to prior headaches. The quality of the pain is described as aching. The pain is moderate. Associated symptoms include ear pain, sinus pressure, a sore throat and swollen glands. Pertinent negatives include no coughing, neck pain, scalp tenderness or seizures.     Past Medical History  Diagnosis Date  . Ovarian cyst   . Abnormal Pap smear   . Headache   . Anxiety   . H. pylori infection 2008    ?reinfx - re-tx 03/2012 triple pk    Review of Systems  Constitutional: Negative for diaphoresis and fatigue.  HENT: Positive for ear pain, congestion, sore throat, sneezing and sinus pressure. Negative for hoarse voice, facial swelling, neck pain and ear discharge.   Respiratory: Negative for cough and shortness of breath.   Cardiovascular: Negative for chest pain and palpitations.  Gastrointestinal: Negative for diarrhea, constipation, blood in stool and abdominal distention.  Genitourinary: Negative for urgency and pelvic pain.  Neurological: Positive for headaches. Negative for  seizures.  Psychiatric/Behavioral: Negative for dysphoric mood. The patient is not nervous/anxious.        Objective:   Physical Exam  BP 120/60  Pulse 61  Temp(Src) 97.7 F (36.5 C) (Oral)  Resp 12  Ht 5\' 6"  (1.676 m)  Wt 173 lb 12.8 oz (78.835 kg)  BMI 28.07 kg/m2  SpO2 97%  LMP 01/28/2012 Wt Readings from Last 3 Encounters:  04/23/12 173 lb 12.8 oz (78.835 kg)  03/30/12 176 lb (79.833 kg)  03/05/12 174 lb 9.6 oz (79.198 kg)   Constitutional: She appears fatigued, but well-developed and well-nourished. No distress. mom at side HENT: mild tenderness over frontal sinuses bilaterally, is swollen and red with thick discharge -oropharynx red with mild PND, no exudate -  Neck: Normal range of motion. Neck supple. No LAD or JVD present. No thyromegaly present.  Cardiovascular: Normal rate, regular rhythm and normal heart sounds.  No murmur heard. No BLE edema. Pulmonary/Chest: Effort normal and breath sounds normal. No respiratory distress. She has no wheezes. Neurological: She is alert and oriented to person, place, and time. No cranial nerve deficit. Coordination, balance and speech normal. Psychiatric: She has a dysphoric mood and affect. Her behavior is normal. Judgment and thought content normal.   Lab Results  Component Value Date   WBC 6.6 09/25/2011   HGB 12.5 09/25/2011   HCT 37.7 09/25/2011   PLT 328.0 09/25/2011   GLUCOSE 81 09/25/2011   CHOL 184 09/25/2011   TRIG 48.0 09/25/2011   HDL 57.50 09/25/2011   LDLCALC 117* 09/25/2011   ALT 16 09/25/2011   AST 17 09/25/2011  NA 139 09/25/2011   K 4.6 09/25/2011   CL 106 09/25/2011   CREATININE 0.9 09/25/2011   BUN 10 09/25/2011   CO2 25 09/25/2011   TSH 1.71 09/25/2011      Assessment & Plan:   Sinusitis, acute frontal - complicated by seasonal allergic rhinitis Treat with azithromycin, start Flonase and daily Zyrtec - erx done Patient educatation provided on same  Sinus headache, acute migraine - precipitated by above IM  Toradol 30 mg given today Continue home regimen for chronic migraines as ongoing Work excuse note for today provided, to extend her FMLA for an additional day this week as requested

## 2012-04-23 NOTE — Telephone Encounter (Signed)
Patient Information:  Caller Name: Las[place]ka  Phone: [phone]  Patient: Emma Gardner, Emma Gardner  Gender: Female  DOB: November 29, 1985  Age: 27 Years  PCP: Rene Paci (Adults only)  Pregnant: No  Office Follow Up:  Does the office need to follow up with this patient?: No  Instructions For The Office: N/A   Symptoms  Reason For Call & Symptoms: Patient states congestion and migraine headache since monday. Along with sinus issues. Home treatment not working.  constant Heahache located at temple and eyes, congestion, slight runny nose. Color yellow green productive and thick  Reviewed Health History In EMR: Yes  Reviewed Medications In EMR: Yes  Reviewed Allergies In EMR: Yes  Reviewed Surgeries / Procedures: No  Date of Onset of Symptoms: 04/19/2012  Treatments Tried: Advil congestion relief. Tramadol  Treatments Tried Worked: No OB / GYN:  LMP: 01/28/2012  Guideline(s) Used:  Headache  Sinus Pain and Congestion  Disposition Per Guideline:   See Today in Office  Reason For Disposition Reached:   Earache  Advice Given:  For a Runny Nose With Profuse Discharge:  Nasal mucus and discharge helps to wash viruses and bacteria out of the nose and sinuses.  Blowing the nose is all that is needed.  If the skin around your nostrils gets irritated, apply a tiny amount of petroleum ointment to the nasal openings once or twice a day.  For a Stuffy Nose - Use Nasal Washes:  Introduction: Saline (salt water) nasal irrigation (nasal wash) is an effective and simple home remedy for treating stuffy nose and sinus congestion. The nose can be irrigated by pouring, spraying, or squirting salt water into the nose and then letting it run back out.  How it Helps: The salt water rinses out excess mucus, washes out any irritants (dust, allergens) that might be present, and moistens the nasal cavity.  Methods: There are several ways to perform nasal irrigation. You can use a saline nasal spray bottle  (available over-the-counter), a rubber ear syringe, a medical syringe without the needle, or a Neti Pot.  Nasal Decongestants for a Very Stuffy or Runny Nose:  If you have a very stuffy nose, nasal decongestant medicines can shrink the swollen nasal mucosa and allow for easier breathing. If you have a very runny nose, these medicines can reduce the amount of drainage. They may be taken as pills by mouth or as a nasal spray.  Most people do Not need to use these medicines. If your nose feels blocked, you should try using nasal washes first.  Pseudoephedrine (Sudafed) is available OTC in pill form. Typical adult dosage is two 30 mg tablets every 6 hours.  Hydration:  Drink plenty of liquids (6-8 glasses of water daily). If the air in your home is dry, use a cool mist humidifier  Call Back If:   Severe pain lasts longer than 2 hours after pain medicine  Sinus congestion (fullness) lasts longer than 10 days  Fever lasts longer than 3 days  You become worse.  Patient Will Follow Care Advice:  YES  Appointment Scheduled:  04/23/2012 16:00:00 Appointment Scheduled Provider:  Rene Paci (Adults only)

## 2012-04-23 NOTE — Patient Instructions (Addendum)
It was good to see you today. Toradol shot given for headache pain today Zpak antibiotics x 5 days and start Flonase everyday for sinus/allergy nose spray -  Your prescription(s) have been submitted to your pharmacy. Please take as directed and contact our office if you believe you are having problem(s) with the medication(s). Start Zyrtec 10mg  once daily every day for next 30 days (over the counter) Alternate between ibuprofen, tramadol and tylenol for aches, pain and fever symptoms as discussed Hydrate, rest and call if worse or unimproved Work excuse note for today as discussed -Will update employer FMLA for this week as discussed Sinus Headache A sinus headache is when your sinuses become clogged or swollen. Sinus headaches can range from mild to severe.  CAUSES A sinus headache can have different causes, such as:  Colds.  Sinus infections.  Allergies. SYMPTOMS  Symptoms of a sinus headache may vary and can include:  Headache.  Pain or pressure in the face.  Congested or runny nose.  Fever.  Inability to smell.  Pain in upper teeth. Weather changes can make symptoms worse. TREATMENT  The treatment of a sinus headache depends on the cause.  Sinus pain caused by a sinus infection may be treated with antibiotic medicine.  Sinus pain caused by allergies may be helped by allergy medicines (antihistamines) and medicated nasal sprays.  Sinus pain caused by congestion may be helped by flushing the nose and sinuses with saline solution. HOME CARE INSTRUCTIONS   If antibiotics are prescribed, take them as directed. Finish them even if you start to feel better.  Only take over-the-counter or prescription medicines for pain, discomfort, or fever as directed by your caregiver.  If you have congestion, use a nasal spray to help reduce pressure. SEEK IMMEDIATE MEDICAL CARE IF:  You have a fever.  You have headaches more than once a week.  You have sensitivity to light or  sound.  You have repeated nausea and vomiting.  You have vision problems.  You have sudden, severe pain in your face or head.  You have a seizure.  You are confused.  Your sinus headaches do not get better after treatment. Many people think they have a sinus headache when they actually have migraines or tension headaches. MAKE SURE YOU:   Understand these instructions.  Will watch your condition.  Will get help right away if you are not doing well or get worse. Document Released: 02/21/2004 Document Revised: 04/07/2011 Document Reviewed: 04/13/2010 [facility] Patient Information 2013 [place], [place].

## 2012-04-30 ENCOUNTER — Telehealth: Payer: Self-pay | Admitting: Internal Medicine

## 2012-04-30 NOTE — Telephone Encounter (Signed)
Caller: Kemper/Patient; Phone: [phone]; Reason for Call: Seen in office on 04/23/12 and FMLA papers sent in incomplete---Needs DATE SEEN in office put on [employer] papers and have it INITIALED and it resubmitted.

## 2012-04-30 NOTE — Telephone Encounter (Signed)
We did not receive new FMLA forms to be filled out, added the office visit date to her previous FMLA forms initialed and faxed to [place] [phone])

## 2012-05-06 ENCOUNTER — Encounter (HOSPITAL_COMMUNITY): Payer: Self-pay | Admitting: *Deleted

## 2012-05-06 ENCOUNTER — Inpatient Hospital Stay (HOSPITAL_COMMUNITY)
Admission: AD | Admit: 2012-05-06 | Discharge: 2012-05-06 | Disposition: A | Discharge status: Home / Self Care | Payer: 59 | Source: Ambulatory Visit | Attending: Obstetrics and Gynecology | Admitting: Obstetrics and Gynecology

## 2012-05-06 DIAGNOSIS — N83209 Unspecified ovarian cyst, unspecified side: Secondary | ICD-10-CM | POA: Insufficient documentation

## 2012-05-06 DIAGNOSIS — R109 Unspecified abdominal pain: Secondary | ICD-10-CM

## 2012-05-06 DIAGNOSIS — N949 Unspecified condition associated with female genital organs and menstrual cycle: Secondary | ICD-10-CM | POA: Insufficient documentation

## 2012-05-06 LAB — URINALYSIS, ROUTINE W REFLEX MICROSCOPIC
Bilirubin Urine: NEGATIVE
Glucose, UA: NEGATIVE mg/dL
Ketones, ur: NEGATIVE mg/dL
Leukocytes, UA: NEGATIVE
pH: 5.5 (ref 5.0–8.0)

## 2012-05-06 LAB — URINE MICROSCOPIC-ADD ON

## 2012-05-06 MED ORDER — KETOROLAC TROMETHAMINE 60 MG/2ML IM SOLN
60.0000 mg | Freq: Once | INTRAMUSCULAR | Status: AC
  Administered 2012-05-06: 60 mg via INTRAMUSCULAR
  Filled 2012-05-06: qty 2

## 2012-05-06 MED ORDER — IBUPROFEN 600 MG PO TABS: 600.0000 mg | ORAL_TABLET | Freq: Four times a day (QID) | ORAL | Status: DC | PRN

## 2012-05-06 NOTE — MAU Note (Signed)
LLQ pain, was really bad yesterday. Hx of ovarian cyst.  Had lupron shot in Jan, Feb and Mar. Went to office yesterday because of the pain, they did an [place] yesterday, no evidence of ovarian cyst.  Was told might be endometriosis- unable to see that on [place].  Was given pain med. Could not be seen for further eval until the 21st.  Came here because the pain was too bad.

## 2012-05-06 NOTE — MAU Note (Signed)
Pt took Vicodin x 1 about 1300 after leaving work and on the way to MAU and now rates pain of 8/10.

## 2012-05-06 NOTE — MAU Provider Note (Signed)
History     CSN: [id number]  Arrival date and time: 05/06/12 1341   First Provider Initiated Contact with Patient 05/06/12 1414      Chief Complaint  Patient presents with  . Abdominal Pain   HPI Emma Gardner is 27 y.o. [id number] Unknown weeks presenting with abdominal pain that began 2 days ago.  Worse yesterday.  Was seen in the office for ultrasound for hx ovarian cysts.  None seen yesterday.  Patient of Dr. Berenda Morale.  Was given Rx for vicodin.  She took 1 dose that helped but she cannot work while taking medication.  Is worried she will loose her job.  Denies allergies.  LMP 12/13-- gets monthly Lupron to suppress cycles because of pain with ovarian cysts.  Denies vaginal bleeding.  1 sexual partner X 10 years.  Nausea without vomiting.   Has H. Pylori "so it may be that".    Past Medical History  Diagnosis Date  . Ovarian cyst   . Abnormal Pap smear   . Headache   . Anxiety   . H. pylori infection 2008    ?reinfx - re-tx 03/2012 triple pk    Past Surgical History  Procedure Laterality Date  . Endometrial biopsy      normal  . Cervical biopsy    . Essure tubal ligation  07/2011    Family History  Problem Relation Age of Onset  . Dementia Paternal Grandfather   . Dementia Paternal Grandmother   . Depression Mother   . Depression Maternal Grandmother     History  Substance Use Topics  . Smoking status: Former Smoker    Types: Cigars    Quit date: 10/04/2010  . Smokeless tobacco: Never Used  . Alcohol Use: No    Allergies: No Known Allergies  Prescriptions prior to admission  Medication Sig Dispense Refill  . famotidine (PEPCID) 20 MG tablet Take 1 tablet (20 mg total) by mouth 2 (two) times daily.  60 tablet  1  . fluticasone (FLONASE) 50 MCG/ACT nasal spray Place 2 sprays into the nose daily.  16 g  2  . ibuprofen (ADVIL,MOTRIN) 200 MG tablet Take 400 mg by mouth every 8 (eight) hours as needed for pain or headache.      . metoCLOPramide (REGLAN) 5 MG  tablet Take 1 tablet (5 mg total) by mouth 3 (three) times daily with meals.  90 tablet  0  . MICROGESTIN FE 1/20 1-20 MG-MCG tablet TAKE ONE TABLET BY MOUTH EVERY DAY  1 each  10    Review of Systems  Constitutional: Negative for fever and chills.  Gastrointestinal: Positive for abdominal pain (lower abdominal pain). Negative for nausea and vomiting.  Genitourinary:       Neg for vaginal discharge or bleeding  Neurological: Negative for headaches.   Physical Exam   Blood pressure 128/72, pulse 71, temperature 97.9 F (36.6 C), temperature source Oral, resp. rate 20, height 5\' 5"  (1.651 m), weight 175 lb (79.379 kg), last menstrual period 01/07/2012.  Physical Exam  Constitutional: She is oriented to person, place, and time. She appears well-developed and well-nourished. No distress.  HENT:  Head: Normocephalic.  Neck: Normal range of motion.  Cardiovascular: Normal rate.   Respiratory: Effort normal.  GI: Soft. She exhibits no distension and no mass. There is no tenderness. There is no rebound and no guarding.  Genitourinary: There is no rash, tenderness or lesion on the right labia. There is no rash, tenderness or lesion on the  left labia. Right adnexum displays tenderness (mild). Right adnexum displays no mass and no fullness. Left adnexum displays tenderness (mild). Left adnexum displays no mass and no fullness. No bleeding around the vagina. No vaginal discharge found.  Neurological: She is alert and oriented to person, place, and time.  Skin: Skin is warm and dry.  Psychiatric: She has a normal mood and affect. Her behavior is normal.   Exam was done after patient was given Toradol Results for orders placed during the hospital encounter of 05/06/12 (from the past 24 hour(s))  URINALYSIS, ROUTINE W REFLEX MICROSCOPIC     Status: Abnormal   Collection Time    05/06/12  2:00 PM      Result Value Range   Color, Urine YELLOW  YELLOW   APPearance CLEAR  CLEAR   Specific Gravity,  Urine 1.025  1.005 - 1.030   pH 5.5  5.0 - 8.0   Glucose, UA NEGATIVE  NEGATIVE mg/dL   Hgb urine dipstick TRACE (*) NEGATIVE   Bilirubin Urine NEGATIVE  NEGATIVE   Ketones, ur NEGATIVE  NEGATIVE mg/dL   Protein, ur NEGATIVE  NEGATIVE mg/dL   Urobilinogen, UA 0.2  0.0 - 1.0 mg/dL   Nitrite NEGATIVE  NEGATIVE   Leukocytes, UA NEGATIVE  NEGATIVE  URINE MICROSCOPIC-ADD ON     Status: Abnormal   Collection Time    05/06/12  2:00 PM      Result Value Range   Squamous Epithelial / LPF FEW (*) RARE   WBC, UA 0-2  <3 WBC/hpf   RBC / HPF 0-2  <3 RBC/hpf   Bacteria, UA RARE  RARE  POCT PREGNANCY, URINE     Status: None   Collection Time    05/06/12  2:20 PM      Result Value Range   Preg Test, Ur NEGATIVE  NEGATIVE    MAU Course  Procedures  MDM 14:26  Reported MSE to Dr. Jackelyn Knife.  Order given for Toradol 60mg  IM and to follow up in the office.  She may take ibuprofen during the day for discomfort and use the Vicodin at night.  Assessment and Plan  A:  Pelvic pain      Hx of ovarian cyst  P:  May take ibuprofen prn pain after tomorrow      Has vicodin for more severe pain      Call the office for appt for re-check   Emma Gardner,EVE M 05/06/2012, 2:17 PM

## 2012-05-18 ENCOUNTER — Telehealth: Payer: Self-pay | Admitting: Internal Medicine

## 2012-05-18 NOTE — Telephone Encounter (Signed)
Patient Information:  Caller Name: Liel  Phone: [phone]  Patient: Emma Gardner  Gender: Female  DOB: 1985/07/28  Age: 27 Years  PCP: Rene Paci (Adults only)  Pregnant: No  Office Follow Up:  Does the office need to follow up with this patient?: No  Instructions For The Office: N/A   Symptoms  Reason For Call & Symptoms: Headache and sore/swollen throat. Appointments with Dr. Felicity Coyer were full for 05/18/12. Offered to schedule with a different provider. Patient requested to see Dr. Felicity Coyer only.  Reviewed Health History In EMR: Yes  Reviewed Medications In EMR: Yes  Reviewed Allergies In EMR: Yes  Reviewed Surgeries / Procedures: Yes  Date of Onset of Symptoms: 05/17/2012  Treatments Tried: Butol  Treatments Tried Worked: No OB / GYN:  LMP: Unknown  Guideline(s) Used:  Sore Throat  Disposition Per Guideline:   See Today in Office  Reason For Disposition Reached:   Patient wants to be seen  Advice Given:  N/A  Patient Will Follow Care Advice:  YES  Appointment Scheduled:  05/19/2012 11:30:00 Appointment Scheduled Provider:  Rene Paci (Adults only)

## 2012-05-19 ENCOUNTER — Ambulatory Visit (INDEPENDENT_AMBULATORY_CARE_PROVIDER_SITE_OTHER): Payer: 59 | Admitting: Obstetrics & Gynecology

## 2012-05-19 ENCOUNTER — Ambulatory Visit (INDEPENDENT_AMBULATORY_CARE_PROVIDER_SITE_OTHER): Payer: 59 | Admitting: Internal Medicine

## 2012-05-19 ENCOUNTER — Encounter: Payer: Self-pay | Admitting: Internal Medicine

## 2012-05-19 VITALS — BP 102/70 | HR 60 | Temp 98.5°F

## 2012-05-19 VITALS — BP 120/76 | HR 66 | Temp 98.1°F | Ht 66.0 in | Wt 176.0 lb

## 2012-05-19 DIAGNOSIS — Z3202 Encounter for pregnancy test, result negative: Secondary | ICD-10-CM

## 2012-05-19 DIAGNOSIS — R51 Headache: Secondary | ICD-10-CM

## 2012-05-19 DIAGNOSIS — F411 Generalized anxiety disorder: Secondary | ICD-10-CM

## 2012-05-19 DIAGNOSIS — R102 Pelvic and perineal pain: Secondary | ICD-10-CM

## 2012-05-19 DIAGNOSIS — N949 Unspecified condition associated with female genital organs and menstrual cycle: Secondary | ICD-10-CM

## 2012-05-19 LAB — POCT URINE PREGNANCY: Preg Test, Ur: NEGATIVE

## 2012-05-19 MED ORDER — MELOXICAM 7.5 MG PO TABS: 7.5000 mg | ORAL_TABLET | Freq: Every day | ORAL | Status: DC

## 2012-05-19 MED ORDER — MELOXICAM 15 MG PO TABS: 15.0000 mg | ORAL_TABLET | Freq: Every day | ORAL | Status: DC

## 2012-05-19 MED ORDER — ALPRAZOLAM 0.5 MG PO TABS: 0.5000 mg | ORAL_TABLET | Freq: Three times a day (TID) | ORAL | Status: DC | PRN

## 2012-05-19 MED ORDER — PAROXETINE HCL 20 MG PO TABS: 20.0000 mg | ORAL_TABLET | ORAL | Status: DC

## 2012-05-19 NOTE — Assessment & Plan Note (Signed)
Overlap with depression -  strong FH same; ongoing symptoms > 12 weeks.  Pt accepting of possible dx and understanding of relationship between headache, nausea and "stress", agreeable to start antidepressant at this time.  Educated on symptoms anxiety and depression, possible tx options, and stress mgmt. Verified no SI/HI -  Start paxil and prn xanax - we reviewed potential risk/benefit and possible side effects - pt understands and agrees to same  Pt agrees to call if symptoms worse or unimproved, follow up 6 weeks on same

## 2012-05-19 NOTE — Patient Instructions (Signed)
It was good to see you today. We have reviewed your prior records including labs and tests today Use meloxicam as needed for pain once daily Start generic Paxil daily for anxiety and depression symptoms, also okay to use Xanax if needed for nervousness/anxiety symptoms Your prescription(s) have been submitted to your pharmacy. Please take as directed and contact our office if you believe you are having problem(s) with the medication(s). Followup in 6 weeks to review symptoms and medications, call sooner if problems

## 2012-05-19 NOTE — Progress Notes (Signed)
.   Subjective:     Emma Gardner is a 27 y.o. female here for a consult/second opinion.  Current complaints: Pt states that she has a lot of pelvic pain.  She has a history of ovarian cysts (had a normal ultrasound last week).  Her current GYN doctor wants to schedule a hysterectomy for possible endometerosis. She has tried the Essure procedure but it was unsuccessful.  She has tried Lupron and Vicodin that is not working to help her pain.  Personal health questionnaire reviewed: no.  See pelvic pain questionnaire.   Gynecologic History Patient's last menstrual period was 01/07/2012. Contraception: none and Microgestin Last Pap:06/2011 . Results were: normal Last mammogram: N/A  Obstetric History OB History   Grav Para Term Preterm Abortions TAB SAB Ect Mult Living   2 2 2       2      # Outc Date GA Lbr Len/2nd Wgt Sex Del Anes PTL Lv   1 TRM            2 TRM                The following portions of the patient's history were reviewed and updated as appropriate: allergies, current medications, past family history, past medical history, past social history, past surgical history and problem list.  Review of Systems Pertinent items are noted in HPI.    Objective:   General:  alert     Abdomen: soft, non-tender; bowel sounds normal; no masses,  no organomegaly   Vulva:  normal  Vagina: normal vagina; posterior CDS tenderness; no levator tenderness, no uterosacral nodularity  Cervix:  no lesions  Corpus: normal size, contour, position, consistency, mobility, non-tender  Adnexa:  No masses, left adnexa tender    Assessment:   Pelvic pain H/O depression  Plan:   Review previous records Diagnostic laparoscopy--risks/benefits reviewed

## 2012-05-19 NOTE — Progress Notes (Signed)
  Subjective:    Patient ID: Emma Gardner, female    DOB: May 17, 1985, 27 y.o.   MRN: [id number]  Migraine  This is a chronic problem. The current episode started yesterday. The problem occurs constantly. The problem has been gradually improving. The pain is located in the frontal and bilateral region. The pain does not radiate. The pain quality is similar to prior headaches. The quality of the pain is described as aching. The pain is moderate. Associated symptoms include a sore throat. Pertinent negatives include no coughing, fever, rhinorrhea or sinus pressure.     Past Medical History  Diagnosis Date  . Ovarian cyst   . Abnormal Pap smear   . Headache   . Anxiety   . H. pylori infection 2008    ?reinfx - re-tx 03/2012 triple pk    Review of Systems  Constitutional: Positive for fatigue. Negative for fever.  HENT: Positive for sore throat. Negative for congestion, rhinorrhea, sneezing, postnasal drip and sinus pressure.   Respiratory: Negative for cough and shortness of breath.   Cardiovascular: Negative for chest pain and palpitations.  Gastrointestinal: Negative for constipation, blood in stool and abdominal distention.  Neurological: Positive for headaches. Negative for light-headedness.  Psychiatric/Behavioral: Positive for sleep disturbance, dysphoric mood and decreased concentration. Negative for suicidal ideas, confusion, self-injury and agitation. The patient is nervous/anxious.        Objective:   Physical Exam  BP 102/70  Pulse 60  Temp(Src) 98.5 F (36.9 C) (Oral)  SpO2 98%  LMP 01/07/2012 Wt Readings from Last 3 Encounters:  05/06/12 175 lb (79.379 kg)  04/23/12 173 lb 12.8 oz (78.835 kg)  03/30/12 176 lb (79.833 kg)   Constitutional: She appears fatigued, but well-developed and well-nourished. No distress.  HENT: oropharynx mildly red with minimal PND, no exudate -  Neck: Normal range of motion. Neck supple. No LAD or JVD present. No thyromegaly present.   Cardiovascular: Normal rate, regular rhythm and normal heart sounds.  No murmur heard. No BLE edema. Pulmonary/Chest: Effort normal and breath sounds normal. No respiratory distress. She has no wheezes. Neurological: She is alert and oriented to person, place, and time. No cranial nerve deficit. Coordination, balance and speech normal. Psychiatric: She has a dysphoric mood and affect. Her behavior is normal. Judgment and thought content normal.   Lab Results  Component Value Date   WBC 6.6 09/25/2011   HGB 12.5 09/25/2011   HCT 37.7 09/25/2011   PLT 328.0 09/25/2011   GLUCOSE 81 09/25/2011   CHOL 184 09/25/2011   TRIG 48.0 09/25/2011   HDL 57.50 09/25/2011   LDLCALC 117* 09/25/2011   ALT 16 09/25/2011   AST 17 09/25/2011   NA 139 09/25/2011   K 4.6 09/25/2011   CL 106 09/25/2011   CREATININE 0.9 09/25/2011   BUN 10 09/25/2011   CO2 25 09/25/2011   TSH 1.71 09/25/2011      Assessment & Plan:   1) chronic headache, hx migraines and exacerbated by "stress" MRI brain 02/2012 WNL - reviewed today 2) Pelvic pain, ov cyst - working with gyn on same - tx with hydrocodone but causes nausea 3) Stress/anxiety 4) pharyngitis/sore throat - no evidence for infection on hx or exam - tx for allergic rhinitis symptomatically  Will begin paxil and prn xanax - see below advised meloxicam prn in place of vicodin for pelvic pain symptoms Will work to extend her FMLA for an additional day this week as requested

## 2012-05-19 NOTE — Patient Instructions (Addendum)
Diagnostic Laparoscopy  Laparoscopy is a surgical procedure. It is used to diagnose and treat diseases inside the belly(abdomen). It is usually a brief, common, and relatively simple procedure. The laparoscopeis a thin, lighted, pencil-sized instrument. It is like a telescope. It is inserted into your abdomen through a small cut (incision). Your caregiver can look at the organs inside your body through this instrument. He or she can see if there is anything abnormal.  Laparoscopy can be done either in a hospital or outpatient clinic. You may be given a mild sedative to help you relax before the procedure. Once in the operating room, you will be given a drug to make you sleep (general anesthesia). Laparoscopy usually lasts less than 1 hour. After the procedure, you will be monitored in a recovery area until you are stable and doing well. Once you are home, it will take 2 to 3 days to fully recover.  RISKS AND COMPLICATIONS   Laparoscopy has relatively few risks. Your caregiver will discuss the risks with you before the procedure.  Some problems that can occur include:  · Infection.  · Bleeding.  · Damage to other organs.  · Anesthetic side effects.  PROCEDURE  Once you receive anesthesia, your surgeon inflates the abdomen with a harmless gas (carbon dioxide). This makes the organs easier to see. The laparoscope is inserted into the abdomen through a small incision. This allows your surgeon to see into the abdomen. Other small instruments are also inserted into the abdomen through other small openings. Many surgeons attach a video camera to the laparoscope to enlarge the view.  During a diagnostic laparoscopy, the surgeon may be looking for inflammation, infection, or cancer. Your surgeon may take tissue samples(biopsies). The samples are sent to a specialist in looking at cells and tissue samples (pathologist). The pathologist examines them under a microscope. Biopsies can help to diagnose or confirm a  disease.  AFTER THE PROCEDURE   · The gas is released from inside the abdomen.  · The incisions are closed with stitches (sutures). Because these incisions are small (usually less than 1/2 inch), there is usually minimal discomfort after the procedure. There may be some mild discomfort in the throat. This is from the tube placed in the throat while you were sleeping. You may have some mild abdominal discomfort. There may also be discomfort from the instrument placement incisions in the abdomen.  · The recovery time is shortened as long as there are no complications.  · You will rest in a recovery room until stable and doing well. As long as there are no complications, you may be allowed to go home.  FINDING OUT THE RESULTS OF YOUR TEST  Not all test results are available during your visit. If your test results are not back during the visit, make an appointment with your caregiver to find out the results. Do not assume everything is normal if you have not heard from your caregiver or the medical facility. It is important for you to follow up on all of your test results.  HOME CARE INSTRUCTIONS   · Take all medicines as directed.  · Only take over-the-counter or prescription medicines for pain, discomfort, or fever as directed by your caregiver.  · Resume daily activities as directed.  · Showers are preferred over baths.  · You may resume sexual activities in 1 week or as directed.  · Do not drive while taking narcotics.  SEEK MEDICAL CARE IF:   · There is increasing   abdominal pain.  · There is new pain in the shoulders (shoulder strap areas).  · You feel lightheaded or faint.  · You have the chills.  · You or your child has an oral temperature above 102° F (38.9° C).  · There is pus-like (purulent) drainage from any of the wounds.  · You are unable to pass gas or have a bowel movement.  · You feel sick to your stomach (nauseous) or throw up (vomit).  MAKE SURE YOU:   · Understand these instructions.  · Will watch  your condition.  · Will get help right away if you are not doing well or get worse.  Document Released: 04/21/2000 Document Revised: 04/07/2011 Document Reviewed: 01/13/2007  [employer]® Patient Information ©2013 [employer].

## 2012-05-20 ENCOUNTER — Encounter: Payer: Self-pay | Admitting: Obstetrics & Gynecology

## 2012-05-20 DIAGNOSIS — R102 Pelvic and perineal pain: Secondary | ICD-10-CM | POA: Insufficient documentation

## 2012-05-20 LAB — GC/CHLAMYDIA PROBE AMP
CT Probe RNA: NEGATIVE
GC Probe RNA: NEGATIVE

## 2012-05-20 LAB — WET PREP BY MOLECULAR PROBE: Candida species: NEGATIVE

## 2012-05-24 ENCOUNTER — Encounter (HOSPITAL_COMMUNITY): Payer: Self-pay | Admitting: Pharmacist

## 2012-05-25 ENCOUNTER — Other Ambulatory Visit: Payer: Self-pay | Admitting: *Deleted

## 2012-06-02 ENCOUNTER — Encounter (HOSPITAL_COMMUNITY)
Admission: RE | Admit: 2012-06-02 | Discharge: 2012-06-02 | Disposition: A | Discharge status: Home / Self Care | Payer: 59 | Source: Ambulatory Visit | Attending: Obstetrics & Gynecology | Admitting: Obstetrics & Gynecology

## 2012-06-02 ENCOUNTER — Encounter (HOSPITAL_COMMUNITY): Payer: Self-pay

## 2012-06-02 LAB — CBC
HCT: 33.7 % — ABNORMAL LOW (ref 36.0–46.0)
MCV: 94.1 fL (ref 78.0–100.0)
Platelets: 346 10*3/uL (ref 150–400)
RBC: 3.58 MIL/uL — ABNORMAL LOW (ref 3.87–5.11)
RDW: 13.9 % (ref 11.5–15.5)
WBC: 10.2 10*3/uL (ref 4.0–10.5)

## 2012-06-02 LAB — SURGICAL PCR SCREEN: Staphylococcus aureus: NEGATIVE

## 2012-06-02 NOTE — Patient Instructions (Addendum)
   Your procedure is scheduled on: Friday, May 9th  Enter through the Main Entrance of [facility] at: 130 pm Pick up the phone at the desk and dial [phone] and inform us of your arrival.  Please call this number if you have any problems the morning of surgery: [phone]  Remember: Do not eat food after midnight: Thursday Do not drink clear liquids after: 11 am Take these medicines the morning of surgery with a SIP OF WATER:  Do not wear jewelry, make-up, or FINGER nail polish No metal in your hair or on your body. Do not wear lotions, powders, perfumes. You may wear deodorant.  Please use your CHG wash as directed prior to surgery.  Do not shave anywhere for at least 12 hours prior to first CHG shower.  Do not bring valuables to the hospital. Contacts, dentures or bridgework may not be worn into surgery.  Leave suitcase in the car. After Surgery it may be brought to your room. For patients being admitted to the hospital, checkout time is 11:00am the day of discharge.  Patients discharged on the day of surgery will not be allowed to drive home.  Home with mother Fara Chute or husband Casimiro Needle .

## 2012-06-04 ENCOUNTER — Encounter (HOSPITAL_COMMUNITY): Payer: Self-pay | Admitting: Anesthesiology

## 2012-06-04 ENCOUNTER — Encounter (HOSPITAL_COMMUNITY): Payer: Self-pay | Admitting: *Deleted

## 2012-06-04 ENCOUNTER — Encounter (HOSPITAL_COMMUNITY)
Admission: RE | Disposition: A | Discharge status: Home / Self Care | Payer: Self-pay | Source: Ambulatory Visit | Attending: Obstetrics & Gynecology

## 2012-06-04 ENCOUNTER — Ambulatory Visit (HOSPITAL_COMMUNITY)
Admission: RE | Admit: 2012-06-04 | Discharge: 2012-06-04 | Disposition: A | Discharge status: Home / Self Care | Payer: 59 | Source: Ambulatory Visit | Attending: Obstetrics & Gynecology | Admitting: Obstetrics & Gynecology

## 2012-06-04 ENCOUNTER — Ambulatory Visit (HOSPITAL_COMMUNITY): Payer: 59 | Admitting: Anesthesiology

## 2012-06-04 DIAGNOSIS — G8929 Other chronic pain: Secondary | ICD-10-CM | POA: Insufficient documentation

## 2012-06-04 DIAGNOSIS — N949 Unspecified condition associated with female genital organs and menstrual cycle: Secondary | ICD-10-CM

## 2012-06-04 DIAGNOSIS — R102 Pelvic and perineal pain: Secondary | ICD-10-CM

## 2012-06-04 HISTORY — PX: LAPAROSCOPY: SHX197

## 2012-06-04 SURGERY — LAPAROSCOPY, DIAGNOSTIC
Anesthesia: General | Site: Abdomen | Wound class: Clean Contaminated

## 2012-06-04 MED ORDER — FENTANYL CITRATE 0.05 MG/ML IJ SOLN
INTRAMUSCULAR | Status: AC
  Filled 2012-06-04: qty 2

## 2012-06-04 MED ORDER — ACETAMINOPHEN 650 MG RE SUPP
650.0000 mg | RECTAL | Status: DC | PRN
  Filled 2012-06-04: qty 1

## 2012-06-04 MED ORDER — MEPERIDINE HCL 25 MG/ML IJ SOLN: 6.2500 mg | INTRAMUSCULAR | Status: DC | PRN

## 2012-06-04 MED ORDER — NEOSTIGMINE METHYLSULFATE 1 MG/ML IJ SOLN
INTRAMUSCULAR | Status: DC | PRN
  Administered 2012-06-04 (×2): 2 mg via INTRAVENOUS

## 2012-06-04 MED ORDER — MIDAZOLAM HCL 2 MG/2ML IJ SOLN
INTRAMUSCULAR | Status: AC
  Filled 2012-06-04: qty 2

## 2012-06-04 MED ORDER — LIDOCAINE HCL (CARDIAC) 20 MG/ML IV SOLN
INTRAVENOUS | Status: AC
Start: 2012-06-04 — End: 2012-06-04
  Filled 2012-06-04: qty 5

## 2012-06-04 MED ORDER — GLYCOPYRROLATE 0.2 MG/ML IJ SOLN
INTRAMUSCULAR | Status: AC
  Filled 2012-06-04: qty 1

## 2012-06-04 MED ORDER — ROCURONIUM BROMIDE 100 MG/10ML IV SOLN
INTRAVENOUS | Status: DC | PRN
  Administered 2012-06-04: 40 mg via INTRAVENOUS

## 2012-06-04 MED ORDER — 0.9 % SODIUM CHLORIDE (POUR BTL) OPTIME
TOPICAL | Status: DC | PRN
  Administered 2012-06-04: 1000 mL

## 2012-06-04 MED ORDER — FENTANYL CITRATE 0.05 MG/ML IJ SOLN
INTRAMUSCULAR | Status: DC | PRN
  Administered 2012-06-04: 100 ug via INTRAVENOUS

## 2012-06-04 MED ORDER — OXYCODONE HCL 5 MG PO TABS: 5.0000 mg | ORAL_TABLET | ORAL | Status: DC | PRN

## 2012-06-04 MED ORDER — OXYCODONE-ACETAMINOPHEN 5-325 MG PO TABS
ORAL_TABLET | ORAL | Status: AC
  Administered 2012-06-04: 1
  Filled 2012-06-04: qty 2

## 2012-06-04 MED ORDER — BUPIVACAINE HCL (PF) 0.25 % IJ SOLN
INTRAMUSCULAR | Status: DC | PRN
  Administered 2012-06-04: 8 mL

## 2012-06-04 MED ORDER — PROPOFOL 10 MG/ML IV EMUL
INTRAVENOUS | Status: AC
  Filled 2012-06-04: qty 20

## 2012-06-04 MED ORDER — ONDANSETRON HCL 4 MG/2ML IJ SOLN
INTRAMUSCULAR | Status: DC | PRN
  Administered 2012-06-04: 4 mg via INTRAVENOUS

## 2012-06-04 MED ORDER — KETOROLAC TROMETHAMINE 30 MG/ML IJ SOLN
15.0000 mg | Freq: Once | INTRAMUSCULAR | Status: AC | PRN
  Administered 2012-06-04: 30 mg via INTRAVENOUS

## 2012-06-04 MED ORDER — KETOROLAC TROMETHAMINE 30 MG/ML IJ SOLN
INTRAMUSCULAR | Status: AC
  Filled 2012-06-04: qty 1

## 2012-06-04 MED ORDER — SODIUM CHLORIDE 0.9 % IJ SOLN: 3.0000 mL | Freq: Two times a day (BID) | INTRAMUSCULAR | Status: DC

## 2012-06-04 MED ORDER — GLYCOPYRROLATE 0.2 MG/ML IJ SOLN
INTRAMUSCULAR | Status: DC | PRN
  Administered 2012-06-04 (×4): 0.2 mg via INTRAVENOUS

## 2012-06-04 MED ORDER — BUPIVACAINE HCL (PF) 0.25 % IJ SOLN
INTRAMUSCULAR | Status: AC
  Filled 2012-06-04: qty 30

## 2012-06-04 MED ORDER — FENTANYL CITRATE 0.05 MG/ML IJ SOLN
25.0000 ug | INTRAMUSCULAR | Status: DC | PRN
  Administered 2012-06-04 (×2): 50 ug via INTRAVENOUS

## 2012-06-04 MED ORDER — MIDAZOLAM HCL 2 MG/2ML IJ SOLN: 0.5000 mg | Freq: Once | INTRAMUSCULAR | Status: DC | PRN

## 2012-06-04 MED ORDER — PROPOFOL 10 MG/ML IV EMUL
INTRAVENOUS | Status: DC | PRN
  Administered 2012-06-04: 180 mg via INTRAVENOUS
  Administered 2012-06-04: 20 mg via INTRAVENOUS

## 2012-06-04 MED ORDER — LACTATED RINGERS IV SOLN
INTRAVENOUS | Status: DC
  Administered 2012-06-04: 50 mL/h via INTRAVENOUS
  Administered 2012-06-04 (×2): via INTRAVENOUS

## 2012-06-04 MED ORDER — OXYCODONE-ACETAMINOPHEN 5-325 MG PO TABS: 2.0000 | ORAL_TABLET | Freq: Four times a day (QID) | ORAL | Status: DC | PRN

## 2012-06-04 MED ORDER — FENTANYL CITRATE 0.05 MG/ML IJ SOLN: INTRAMUSCULAR | Status: DC | PRN

## 2012-06-04 MED ORDER — ROCURONIUM BROMIDE 50 MG/5ML IV SOLN
INTRAVENOUS | Status: AC
  Filled 2012-06-04: qty 1

## 2012-06-04 MED ORDER — KETOROLAC TROMETHAMINE 30 MG/ML IJ SOLN: 30.0000 mg | Freq: Four times a day (QID) | INTRAMUSCULAR | Status: DC

## 2012-06-04 MED ORDER — ONDANSETRON HCL 4 MG/2ML IJ SOLN: 4.0000 mg | Freq: Four times a day (QID) | INTRAMUSCULAR | Status: DC | PRN

## 2012-06-04 MED ORDER — MIDAZOLAM HCL 5 MG/5ML IJ SOLN
INTRAMUSCULAR | Status: DC | PRN
  Administered 2012-06-04: 2 mg via INTRAVENOUS
  Administered 2012-06-04: 50 mg via INTRAVENOUS

## 2012-06-04 MED ORDER — PROMETHAZINE HCL 25 MG/ML IJ SOLN: 6.2500 mg | INTRAMUSCULAR | Status: DC | PRN

## 2012-06-04 MED ORDER — FENTANYL CITRATE 0.05 MG/ML IJ SOLN
INTRAMUSCULAR | Status: AC
  Filled 2012-06-04: qty 5

## 2012-06-04 MED ORDER — SODIUM CHLORIDE 0.9 % IV SOLN: 250.0000 mL | INTRAVENOUS | Status: DC | PRN

## 2012-06-04 MED ORDER — ACETAMINOPHEN 325 MG PO TABS: 650.0000 mg | ORAL_TABLET | ORAL | Status: DC | PRN

## 2012-06-04 MED ORDER — SODIUM CHLORIDE 0.9 % IJ SOLN: 3.0000 mL | INTRAMUSCULAR | Status: DC | PRN

## 2012-06-04 MED ORDER — ONDANSETRON HCL 4 MG/2ML IJ SOLN
INTRAMUSCULAR | Status: AC
  Filled 2012-06-04: qty 2

## 2012-06-04 SURGICAL SUPPLY — 23 items
BAG SPEC RTRVL LRG 6X4 10 (ENDOMECHANICALS)
CABLE HIGH FREQUENCY MONO STRZ (ELECTRODE) IMPLANT
CATH ROBINSON RED A/P 16FR (CATHETERS) ×2 IMPLANT
CHLORAPREP W/TINT 26ML (MISCELLANEOUS) ×2 IMPLANT
CLOTH BEACON ORANGE TIMEOUT ST (SAFETY) ×2 IMPLANT
GLOVE BIO SURGEON STRL SZ 6.5 (GLOVE) ×2 IMPLANT
GOWN PREVENTION PLUS LG XLONG (DISPOSABLE) ×4 IMPLANT
NS IRRIG 1000ML POUR BTL (IV SOLUTION) ×2 IMPLANT
PACK LAPAROSCOPY BASIN (CUSTOM PROCEDURE TRAY) ×2 IMPLANT
POUCH SPECIMEN RETRIEVAL 10MM (ENDOMECHANICALS) IMPLANT
PROTECTOR NERVE ULNAR (MISCELLANEOUS) ×2 IMPLANT
SCRUB PCMX 4 OZ (MISCELLANEOUS) ×2 IMPLANT
SEALER TISSUE G2 CVD JAW 35 (ENDOMECHANICALS) IMPLANT
SEALER TISSUE G2 CVD JAW 45CM (ENDOMECHANICALS) IMPLANT
SET IRRIG TUBING LAPAROSCOPIC (IRRIGATION / IRRIGATOR) IMPLANT
SUT MNCRL AB 4-0 PS2 18 (SUTURE) IMPLANT
SUT VICRYL 0 UR6 27IN ABS (SUTURE) ×2 IMPLANT
SUT VICRYL 4-0 PS2 18IN ABS (SUTURE) IMPLANT
TOWEL OR 17X24 6PK STRL BLUE (TOWEL DISPOSABLE) ×4 IMPLANT
TRAY FOLEY CATH 14FR (SET/KITS/TRAYS/PACK) IMPLANT
TROCAR XCEL NON-BLD 11X100MML (ENDOMECHANICALS) ×2 IMPLANT
TROCAR XCEL NON-BLD 5MMX100MML (ENDOMECHANICALS) ×4 IMPLANT
WATER STERILE IRR 1000ML POUR (IV SOLUTION) ×2 IMPLANT

## 2012-06-04 NOTE — Op Note (Signed)
Diagnostic Laparoscopy Procedure Note  Indications: The patient is a 27 y.o. female with pelvic pain.  Pre-operative Diagnosis: Pelvic pain  Post-operative Diagnosis: Same  Surgeon: Antionette Char A   Assistants: None  Anesthesia: General endotracheal anesthesia  ASA Class: 1  Procedure Details  The patient was seen in the Holding Room. The risks, benefits, complications, treatment options, and expected outcomes were discussed with the patient. The possibilities of reaction to medication, pulmonary aspiration, perforation of viscus, bleeding, recurrent infection, the need for additional procedures, failure to diagnose a condition, and creating a complication requiring transfusion or operation were discussed with the patient. The patient concurred with the proposed plan, giving informed consent. The patient was taken to the Operating Room, identified as Emma Gardner and the procedure verified as Diagnostic Laparoscopy. A Time Out was held and the above information confirmed.  After induction of general anesthesia, the patient was placed in modified dorsal lithotomy position where she was prepped, draped, and catheterized in the normal, sterile fashion.  The cervix was visualized and an intrauterine manipulator was placed. A 5 cm umbilical incision was then performed. A 5 mm trocar was passed under direct visualization using an optiview and a pneumoperitoneum was established. A 5 mm trocar was placed in the left lower quadrant.  The findings are noted below.  Following the procedure the trocars were removed after intra-abdominal carbon dioxide was expressed. A skin adhesive was applied to the incisions.   The intrauterine manipulator was then removed.  Instrument, sponge, and needle counts were correct prior to abdominal closure and at the conclusion of the case.   Findings: The anterior cul-de-sac and round ligaments: normal The uterus: normal The adnexa: normal Cul-de-sac: fluid  present--aspirated and sent for cytology Appendix: normal  Estimated Blood Loss:  Minimal         Drains: None         Total IV Fluids: per Anesthesiolgy         Specimens: cul-de-sac fluid for cytology              Complications:  None; patient tolerated the procedure well.         Disposition: PACU - hemodynamically stable.         Condition: stable

## 2012-06-04 NOTE — Anesthesia Preprocedure Evaluation (Signed)
Anesthesia Evaluation  Patient identified by MRN, date of birth, ID band Patient awake    Reviewed: Allergy & Precautions, H&P , Patient's Chart, lab work & pertinent test results, reviewed documented beta blocker date and time   History of Anesthesia Complications Negative for: history of anesthetic complications  Airway Mallampati: II TM Distance: >3 FB Neck ROM: full    Dental no notable dental hx.    Pulmonary neg pulmonary ROS,  breath sounds clear to auscultation  Pulmonary exam normal       Cardiovascular Exercise Tolerance: Good negative cardio ROS  Rhythm:regular Rate:Normal     Neuro/Psych  Headaches, PSYCHIATRIC DISORDERS Anxiety negative neurological ROS  negative psych ROS   GI/Hepatic negative GI ROS, Neg liver ROS,   Endo/Other  negative endocrine ROS  Renal/GU negative Renal ROS     Musculoskeletal   Abdominal   Peds  Hematology negative hematology ROS (+)   Anesthesia Other Findings Ovarian cyst     Abnormal Pap smear        Anxiety     H. pylori infection 2008 ?reinfx - re-tx 03/2012 triple pk    SVD (spontaneous vaginal delivery)   x 2    Reproductive/Obstetrics negative OB ROS                           Anesthesia Physical Anesthesia Plan  ASA: II  Anesthesia Plan: General ETT   Post-op Pain Management:    Induction:   Airway Management Planned:   Additional Equipment:   Intra-op Plan:   Post-operative Plan:   Informed Consent: I have reviewed the patients History and Physical, chart, labs and discussed the procedure including the risks, benefits and alternatives for the proposed anesthesia with the patient or authorized representative who has indicated his/her understanding and acceptance.   Dental Advisory Given  Plan Discussed with: CRNA and Surgeon  Anesthesia Plan Comments:         Anesthesia Quick Evaluation

## 2012-06-04 NOTE — H&P (Signed)
  Chief Complaint: 27 y.o.  who presents with chronic pelvic pain  Details of Present Illness: The patient has a long h/o pelvic pain.  A pain questionnaire was completed.  LMP 01/07/2012  Past Medical History  Diagnosis Date  . Ovarian cyst   . Abnormal Pap smear   . Anxiety   . H. pylori infection 2008    ?reinfx - re-tx 03/2012 triple pk  . SVD (spontaneous vaginal delivery)     x 2  . Headache     otc med prn   History   Social History  . Marital Status: Married    Spouse Name: N/A    Number of Children: N/A  . Years of Education: N/A   Occupational History  . Not on file.   Social History Main Topics  . Smoking status: Former Smoker    Types: Cigars    Quit date: 10/04/2010  . Smokeless tobacco: Never Used  . Alcohol Use: No  . Drug Use: No  . Sexually Active: Yes    Birth Control/ Protection: Surgical, Pill   Other Topics Concern  . Not on file   Social History Narrative  . No narrative on file   Family History  Problem Relation Age of Onset  . Dementia Paternal Grandfather   . Dementia Paternal Grandmother   . Depression Mother   . Depression Maternal Grandmother     Pertinent items are noted in HPI.  Pre-Op Diagnosis: pelvic pain   Planned Procedure: Procedure(s): LAPAROSCOPY DIAGNOSTIC  I have reviewed the patient's history and have completed the physical exam and Emma Gardner is acceptable for surgery.  Roseanna Rainbow, MD 06/04/2012 10:11 AM

## 2012-06-04 NOTE — Anesthesia Postprocedure Evaluation (Signed)
Anesthesia Post Note  Patient: Emma Gardner  Procedure(s) Performed: Procedure(s) (LRB): LAPAROSCOPY DIAGNOSTIC (N/A)  Anesthesia type: GA  Patient location: PACU  Post pain: Pain level controlled  Post assessment: Post-op Vital signs reviewed  Last Vitals:  Filed Vitals:   06/04/12 1358  BP: 114/74  Pulse: 51  Temp: 36.8 C  Resp: 20    Post vital signs: Reviewed  Level of consciousness: sedated  Complications: No apparent anesthesia complications

## 2012-06-04 NOTE — Transfer of Care (Signed)
Immediate Anesthesia Transfer of Care Note  Patient: Emma Gardner  Procedure(s) Performed: Procedure(s): LAPAROSCOPY DIAGNOSTIC (N/A)  Patient Location: PACU  Anesthesia Type:General  Level of Consciousness: awake and oriented  Airway & Oxygen Therapy: Patient Spontanous Breathing and Patient connected to nasal cannula oxygen  Post-op Assessment: Report given to PACU RN and Post -op Vital signs reviewed and stable  Post vital signs: Reviewed and stable  Complications: No apparent anesthesia complications

## 2012-06-06 ENCOUNTER — Emergency Department (HOSPITAL_COMMUNITY)
Admission: EM | Admit: 2012-06-06 | Discharge: 2012-06-06 | Disposition: A | Discharge status: Home / Self Care | Payer: 59 | Attending: Emergency Medicine | Admitting: Emergency Medicine

## 2012-06-06 ENCOUNTER — Encounter (HOSPITAL_COMMUNITY): Payer: Self-pay | Admitting: *Deleted

## 2012-06-06 DIAGNOSIS — H53149 Visual discomfort, unspecified: Secondary | ICD-10-CM | POA: Insufficient documentation

## 2012-06-06 DIAGNOSIS — R112 Nausea with vomiting, unspecified: Secondary | ICD-10-CM

## 2012-06-06 DIAGNOSIS — Z8742 Personal history of other diseases of the female genital tract: Secondary | ICD-10-CM | POA: Insufficient documentation

## 2012-06-06 DIAGNOSIS — R51 Headache: Secondary | ICD-10-CM | POA: Insufficient documentation

## 2012-06-06 DIAGNOSIS — G8929 Other chronic pain: Secondary | ICD-10-CM | POA: Insufficient documentation

## 2012-06-06 DIAGNOSIS — N949 Unspecified condition associated with female genital organs and menstrual cycle: Secondary | ICD-10-CM | POA: Insufficient documentation

## 2012-06-06 DIAGNOSIS — F411 Generalized anxiety disorder: Secondary | ICD-10-CM | POA: Insufficient documentation

## 2012-06-06 DIAGNOSIS — M436 Torticollis: Secondary | ICD-10-CM | POA: Insufficient documentation

## 2012-06-06 DIAGNOSIS — R109 Unspecified abdominal pain: Secondary | ICD-10-CM | POA: Insufficient documentation

## 2012-06-06 DIAGNOSIS — Z87891 Personal history of nicotine dependence: Secondary | ICD-10-CM | POA: Insufficient documentation

## 2012-06-06 DIAGNOSIS — Z79899 Other long term (current) drug therapy: Secondary | ICD-10-CM | POA: Insufficient documentation

## 2012-06-06 DIAGNOSIS — Z9851 Tubal ligation status: Secondary | ICD-10-CM | POA: Insufficient documentation

## 2012-06-06 DIAGNOSIS — Z8619 Personal history of other infectious and parasitic diseases: Secondary | ICD-10-CM | POA: Insufficient documentation

## 2012-06-06 DIAGNOSIS — Z9889 Other specified postprocedural states: Secondary | ICD-10-CM | POA: Insufficient documentation

## 2012-06-06 MED ORDER — ONDANSETRON HCL 4 MG PO TABS: 4.0000 mg | ORAL_TABLET | Freq: Four times a day (QID) | ORAL | Status: DC

## 2012-06-06 MED ORDER — HYDROMORPHONE HCL 2 MG PO TABS: 1.0000 mg | ORAL_TABLET | Freq: Four times a day (QID) | ORAL | Status: DC | PRN

## 2012-06-06 MED ORDER — DEXAMETHASONE SODIUM PHOSPHATE 10 MG/ML IJ SOLN
10.0000 mg | Freq: Once | INTRAMUSCULAR | Status: AC
  Administered 2012-06-06: 10 mg via INTRAVENOUS
  Filled 2012-06-06: qty 1

## 2012-06-06 MED ORDER — DIPHENHYDRAMINE HCL 50 MG/ML IJ SOLN
25.0000 mg | Freq: Once | INTRAMUSCULAR | Status: AC
  Administered 2012-06-06: 25 mg via INTRAVENOUS
  Filled 2012-06-06: qty 1

## 2012-06-06 MED ORDER — METOCLOPRAMIDE HCL 5 MG/ML IJ SOLN
10.0000 mg | Freq: Once | INTRAMUSCULAR | Status: AC
  Administered 2012-06-06: 10 mg via INTRAVENOUS
  Filled 2012-06-06: qty 2

## 2012-06-06 MED ORDER — SODIUM CHLORIDE 0.9 % IV BOLUS (SEPSIS)
1000.0000 mL | Freq: Once | INTRAVENOUS | Status: AC
  Administered 2012-06-06: 1000 mL via INTRAVENOUS

## 2012-06-06 NOTE — ED Notes (Signed)
Pt called ED: states her husband will come by tonight to pick up rx's - bottles left at check in window with RN

## 2012-06-06 NOTE — ED Provider Notes (Signed)
History     CSN: [id number]  Arrival date & time 06/06/12  4098   First MD Initiated Contact with Patient 06/06/12 [phone]      Chief Complaint  Patient presents with  . Migraine  . Nausea    (Consider location/radiation/quality/duration/timing/severity/associated sxs/prior treatment) HPI  Emma Gardner is a 27 y.o. female complaining of severe right. Orbital headache onset yesterday, gradually worsening (this was a very indolent onset) culminating in maximum intensity this morning at 10 out of 10. Patient has been taking Percocet with no relief. The headache is associated with photosensitivity, 2 episodes of nonbloody, nonbilious, non-coffee ground emesis this a.m. Patient recently had a exploratory laparoscopy for chronic pelvic pain. Chart review shows that no overt abnormality was found and a moderate amount of fluid in the cul-de-sac was sent for cytology. Patient endorses a moderate neck stiffness. She denies fever, rash,  otalgia, rhinorrhea, nasal congestion, pharyngitis, phonophobia, chest pain, change in bowel or bladder habits. Patient endorses abdominal pain since the surgery that has not increased in intensity since Friday.   Past Medical History  Diagnosis Date  . Ovarian cyst   . Abnormal Pap smear   . Anxiety   . H. pylori infection 2008    ?reinfx - re-tx 03/2012 triple pk  . SVD (spontaneous vaginal delivery)     x 2  . Headache     otc med prn    Past Surgical History  Procedure Laterality Date  . Endometrial biopsy      normal  . Cervical biopsy    . Essure tubal ligation  07/2011  . Tubal ligation      Family History  Problem Relation Age of Onset  . Dementia Paternal Grandfather   . Dementia Paternal Grandmother   . Depression Mother   . Depression Maternal Grandmother     History  Substance Use Topics  . Smoking status: Former Smoker    Types: Cigars    Quit date: 10/04/2010  . Smokeless tobacco: Never Used  . Alcohol Use: No    OB  History   Grav Para Term Preterm Abortions TAB SAB Ect Mult Living   2 2 2       2       Review of Systems  Constitutional: Negative for fever.  Eyes: Positive for photophobia.  Respiratory: Negative for shortness of breath.   Cardiovascular: Negative for chest pain.  Gastrointestinal: Positive for nausea and vomiting. Negative for abdominal pain and diarrhea.  Skin: Negative for rash.  Neurological: Positive for headaches.  All other systems reviewed and are negative.    Allergies  Review of patient's allergies indicates no known allergies.  Home Medications   Current Outpatient Rx  Name  Route  Sig  Dispense  Refill  . ALPRAZolam (XANAX) 0.5 MG tablet   Oral   Take 1 tablet (0.5 mg total) by mouth 3 (three) times daily as needed for sleep or anxiety.   30 tablet   1   . meloxicam (MOBIC) 15 MG tablet   Oral   Take 1 tablet (15 mg total) by mouth daily.   30 tablet   0   . MICROGESTIN FE 1/20 1-20 MG-MCG tablet      TAKE ONE TABLET BY MOUTH EVERY DAY   1 each   10   . oxyCODONE-acetaminophen (PERCOCET) 5-325 MG per tablet   Oral   Take 2 tablets by mouth every 6 (six) hours as needed for pain.   30 tablet  0   . PARoxetine (PAXIL) 20 MG tablet   Oral   Take 1 tablet (20 mg total) by mouth every morning.   30 tablet   3     BP 121/82  Pulse 97  Resp 16  SpO2 100%  LMP 06/01/2012  Physical Exam  Nursing note and vitals reviewed. Constitutional: She is oriented to person, place, and time. She appears well-developed and well-nourished. No distress.  HENT:  Head: Normocephalic and atraumatic.  Mouth/Throat: Oropharynx is clear and moist.  Eyes: Conjunctivae and EOM are normal. Pupils are equal, round, and reactive to light.  Neck: Normal range of motion. Neck supple.  Patient has full range of motion to neck. Mild tenderness to deep palpation of the posterior cervical spine. Patient can flex chin to chest with no pain.   Cardiovascular: Normal  rate, regular rhythm and intact distal pulses.   Pulmonary/Chest: Effort normal and breath sounds normal. No stridor. No respiratory distress. She has no wheezes. She has no rales. She exhibits no tenderness.  Abdominal: Soft. Bowel sounds are normal. She exhibits no distension and no mass. There is tenderness. There is no rebound and no guarding.  Well-healing trocar scars in the left lower quadrant and umbilicus, clean dry and intact, no erythema, warmth, discharge. Dermabond intact  Musculoskeletal: Normal range of motion.  Neurological: She is alert and oriented to person, place, and time.  Skin: No rash noted.  Psychiatric: She has a normal mood and affect.    ED Course  Procedures (including critical care time)  Labs Reviewed - No data to display No results found.  Medications  sodium chloride 0.9 % bolus 1,000 mL (1,000 mLs Intravenous New Bag/Given 06/06/12 1039)  metoCLOPramide (REGLAN) injection 10 mg (10 mg Intravenous Given 06/06/12 1039)  dexamethasone (DECADRON) injection 10 mg (10 mg Intravenous Given 06/06/12 1039)  diphenhydrAMINE (BENADRYL) injection 25 mg (25 mg Intravenous Given 06/06/12 1039)   1115 patient reports significant subjective improvement in headache she is no longer photophobic appears much more comfortable pain is rated at 7/10.  1210 patient seen and examined at the bedside she is resting comfortably chatting with her family. She reports that her headache and neck pain have completely resolved now she rates her pain as 0/10. Physical exam continues to show no meningeal signs.  1. Headache   2. Nausea & vomiting       MDM   Emma Gardner is a 27 y.o. female with indolent onset of worsening frontal headache associated with nausea and vomiting. On physical exam patient has full range of motion to the neck, the headache had indolent onset speaking over the course of 12 hours: I doubt subarachnoid hemorrhage.  Patient, her mother and brother feel that  this is likely an allergic reaction to the Percocet. Patient's mother and brother are both allergic to oxycodone. N/A also states that her face is slightly more swollen than normal although I do not appreciate this is a gross abnormality.  I have discussed with attending physician Dr. Freida Busman alternate pain control. She states she has had Fioricet and tramadol in the past for her headaches with no relief.  Filed Vitals:   06/06/12 0953 06/06/12 0958 06/06/12 1037  BP:  121/82   Pulse:  97   Temp:   97.7 F (36.5 C)  TempSrc:   Oral  Resp:  16   SpO2: 98% 100%      VSS and patient is appropriate for, and amenable to, discharge at this  time. Pt verbalized understanding and agrees with care plan. Outpatient follow-up and return precautions given.    New Prescriptions   HYDROMORPHONE (DILAUDID) 2 MG TABLET    Take 0.5 tablets (1 mg total) by mouth every 6 (six) hours as needed for pain.   ONDANSETRON (ZOFRAN) 4 MG TABLET    Take 1 tablet (4 mg total) by mouth every 6 (six) hours.           Wynetta Emery, PA-C 06/06/12 1230

## 2012-06-06 NOTE — ED Notes (Signed)
4 rx bottles left behind - called number on file to reach pt, went to voicemail and mailbox full. Will keep rx at nurse's station

## 2012-06-06 NOTE — ED Notes (Signed)
Per ems: pt from home, had ovarian cyst removed last Friday - c/o headache, nausea and vomiting that began last night. Rx for oxycodone, pt states she has been taking them without food. 20 gauge in left hand, 4mg  zofran given by ems. bp 127/80, pulse 60 NSR, saO2 98% ra,

## 2012-06-06 NOTE — ED Notes (Signed)
Pt asleep.

## 2012-06-06 NOTE — ED Notes (Signed)
Rx bottles: hydroco/acetaminophen, oxycodo/aetaminophen, butal/acetaminopen, paroxetine - walked to pharmacy, will store there for patient

## 2012-06-06 NOTE — ED Notes (Signed)
Bed:WA10<BR> Expected date:<BR> Expected time:<BR> Means of arrival:<BR> Comments:<BR> ems 

## 2012-06-06 NOTE — ED Notes (Signed)
Pt called ED: states that her husband will be in tonight to pick up her prescriptions. Rx's left at the front check in desk with RN

## 2012-06-07 ENCOUNTER — Encounter (HOSPITAL_COMMUNITY): Payer: Self-pay | Admitting: Obstetrics & Gynecology

## 2012-06-07 NOTE — ED Provider Notes (Signed)
Medical screening examination/treatment/procedure(s) were performed by non-physician practitioner and as supervising physician I was immediately available for consultation/collaboration.  Kolbi Tofte T Amyah Clawson, MD 06/07/12 1615 

## 2012-06-15 ENCOUNTER — Encounter: Payer: Self-pay | Admitting: Obstetrics

## 2012-06-17 ENCOUNTER — Encounter: Payer: Self-pay | Admitting: Obstetrics & Gynecology

## 2012-06-17 ENCOUNTER — Ambulatory Visit (INDEPENDENT_AMBULATORY_CARE_PROVIDER_SITE_OTHER): Payer: 59 | Admitting: Obstetrics & Gynecology

## 2012-06-17 ENCOUNTER — Encounter: Payer: Self-pay | Admitting: Obstetrics

## 2012-06-17 VITALS — BP 108/70 | HR 68 | Temp 98.4°F | Ht 66.0 in | Wt 178.0 lb

## 2012-06-17 DIAGNOSIS — R109 Unspecified abdominal pain: Secondary | ICD-10-CM

## 2012-06-17 LAB — POCT URINALYSIS DIPSTICK
Glucose, UA: NEGATIVE
Ketones, UA: NEGATIVE
Leukocytes, UA: NEGATIVE

## 2012-06-17 NOTE — Progress Notes (Signed)
Subjective:     Emma Gardner is a 27 y.o. female who presents to the clinic 2 weeks status post laparoscopy for pelvic pain. Eating a regular diet without difficulty. Bowel movements are normal. Pain is not well controlled.  Medications being used: ibuprofen (OTC).  The following portions of the patient's history were reviewed and updated as appropriate: allergies, current medications, past family history, past medical history, past social history, past surgical history and problem list.  Review of Systems Pertinent items are noted in HPI.    Objective:    BP 108/70  Pulse 68  Temp(Src) 98.4 F (36.9 C) (Oral)  Ht 5\' 6"  (1.676 m)  Wt 178 lb (80.74 kg)  BMI 28.74 kg/m2  LMP 06/01/2012 General:  alert  Abdomen: soft, bowel sounds active, non-tender  Incision:   healing well, no drainage, no erythema, no hernia, no seroma, no swelling, no dehiscence, incision well approximated    Informal pelvic U/S normal Assessment:    Doing well postoperatively. Operative findings again reviewed. Pathology report discussed.    Plan:    1. Continue any current medications. 2. Wound care discussed. 3. Activity restrictions: none 4. Anticipated return to work: now. 5. Follow up: several months PT referral for a possible myofascial pain syndrome

## 2012-06-17 NOTE — Patient Instructions (Addendum)
Pelvic Pain Pelvic pain is pain below the belly button and located between your hips. Acute pain may last a few hours or days. Chronic pelvic pain may last weeks and months. The cause may be different for different types of pain. The pain may be dull or sharp, mild or severe and can interfere with your daily activities. Write down and tell your caregiver:   Exactly where the pain is located.  If it comes and goes or is there all the time.  When it happens (with sex, urination, bowel movement, etc.)  If the pain is related to your menstrual period or stress. Your caregiver will take a full history and do a complete physical exam and Pap test. CAUSES   Painful menstrual periods (dysmenorrhea).  Normal ovulation (Mittelschmertz) that occurs in the middle of the menstrual cycle every month.  The pelvic organs get engorged with blood just before the menstrual period (pelvic congestive syndrome).  Scar tissue from an infection or past surgery (pelvic adhesions).  Cancer of the female pelvic organs. When there is pain with cancer, it has been there for a long time.  The lining of the uterus (endometrium) abnormally grows in places like the pelvis and on the pelvic organs (endometriosis).  A form of endometriosis with the lining of the uterus present inside of the muscle tissue of the uterus (adenomyosis).  Fibroid tumor (noncancerous) in the uterus.  Bladder problems such as infection, bladder spasms of the muscle tissue of the bladder.  Intestinal problems (irritable bowel syndrome, colitis, an ulcer or gastrointestinal infection).  Polyps of the cervix or uterus.  Pregnancy in the tube (ectopic pregnancy).  The opening of the cervix is too small for the menstrual blood to flow through it (cervical stenosis).  Physical or sexual abuse (past or present).  Musculo-skeletal problems from poor posture, problems with the vertebrae of the lower back or the uterine pelvic muscles falling  (prolapse).  Psychological problems such as depression or stress.  IUD (intrauterine device) in the uterus. DIAGNOSIS  Tests to make a diagnosis depends on the type, location, severity and what causes the pain to occur. Tests that may be needed include:  Blood tests.  Urine tests  Ultrasound.  X-rays.  CT Scan.  MRI.  Laparoscopy.  Major surgery. TREATMENT  Treatment will depend on the cause of the pain, which includes:  Prescription or over-the-counter pain medication.  Antibiotics.  Birth control pills.  Hormone treatment.  Nerve blocking injections.  Physical therapy.  Antidepressants.  Counseling with a psychiatrist or psychologist.  Minor or major surgery. HOME CARE INSTRUCTIONS   Only take over-the-counter or prescription medicines for pain, discomfort or fever as directed by your caregiver.  Follow your caregiver's advice to treat your pain.  Rest.  Avoid sexual intercourse if it causes the pain.  Apply warm or cold compresses (which ever works best) to the pain area.  Do relaxation exercises such as yoga or meditation.  Try acupuncture.  Avoid stressful situations.  Try group therapy.  If the pain is because of a stomach/intestinal upset, drink clear liquids, eat a bland light food diet until the symptoms go away. SEEK MEDICAL CARE IF:   You need stronger prescription pain medication.  You develop pain with sexual intercourse.  You have pain with urination.  You develop a temperature of 102 F (38.9 C) with the pain.  You are still in pain after 4 hours of taking prescription medication for the pain.  You need depression medication.    Your IUD is causing pain and you want it removed. SEEK IMMEDIATE MEDICAL CARE IF:  You develop very severe pain or tenderness.  You faint, have chills, severe weakness or dehydration.  You develop heavy vaginal bleeding or passing solid tissue.  You develop a temperature of 102 F (38.9 C)  with the pain.  You have blood in the urine.  You are being physically or sexually abused.  You have uncontrolled vomiting and diarrhea.  You are depressed and afraid of harming yourself or someone else. Document Released: 02/21/2004 Document Revised: 04/07/2011 Document Reviewed: 11/18/2007 Exit[employer] Patient Information 2013 [employer].  

## 2012-06-21 ENCOUNTER — Encounter: Payer: Self-pay | Admitting: Obstetrics & Gynecology

## 2012-07-01 ENCOUNTER — Encounter: Payer: Self-pay | Admitting: Obstetrics

## 2012-07-01 ENCOUNTER — Ambulatory Visit: Payer: 59 | Admitting: Obstetrics & Gynecology

## 2012-08-10 ENCOUNTER — Ambulatory Visit: Payer: 59 | Admitting: Physician Assistant

## 2012-08-12 ENCOUNTER — Ambulatory Visit (INDEPENDENT_AMBULATORY_CARE_PROVIDER_SITE_OTHER): Payer: 59 | Admitting: Physician Assistant

## 2012-08-12 ENCOUNTER — Encounter: Payer: Self-pay | Admitting: Physician Assistant

## 2012-08-12 ENCOUNTER — Other Ambulatory Visit (INDEPENDENT_AMBULATORY_CARE_PROVIDER_SITE_OTHER): Payer: 59

## 2012-08-12 VITALS — BP 100/70 | HR 66 | Ht 66.0 in | Wt 186.0 lb

## 2012-08-12 DIAGNOSIS — K2990 Gastroduodenitis, unspecified, without bleeding: Secondary | ICD-10-CM

## 2012-08-12 DIAGNOSIS — K2970 Gastritis, unspecified, without bleeding: Secondary | ICD-10-CM

## 2012-08-12 DIAGNOSIS — R112 Nausea with vomiting, unspecified: Secondary | ICD-10-CM

## 2012-08-12 LAB — CBC WITH DIFFERENTIAL/PLATELET
Basophils Absolute: 0.1 10*3/uL (ref 0.0–0.1)
Eosinophils Relative: 2.1 % (ref 0.0–5.0)
HCT: 36.5 % (ref 36.0–46.0)
Hemoglobin: 12.6 g/dL (ref 12.0–15.0)
Lymphs Abs: 3.3 10*3/uL (ref 0.7–4.0)
MCV: 93.6 fl (ref 78.0–100.0)
Monocytes Absolute: 0.5 10*3/uL (ref 0.1–1.0)
Monocytes Relative: 6.4 % (ref 3.0–12.0)
Neutro Abs: 4 10*3/uL (ref 1.4–7.7)
Platelets: 305 10*3/uL (ref 150.0–400.0)
RDW: 13 % (ref 11.5–14.6)

## 2012-08-12 LAB — COMPREHENSIVE METABOLIC PANEL
AST: 19 U/L (ref 0–37)
Alkaline Phosphatase: 37 U/L — ABNORMAL LOW (ref 39–117)
BUN: 9 mg/dL (ref 6–23)
Creatinine, Ser: 0.8 mg/dL (ref 0.4–1.2)
Total Bilirubin: 0.4 mg/dL (ref 0.3–1.2)

## 2012-08-12 LAB — HCG, QUANTITATIVE, PREGNANCY: hCG, Beta Chain, Quant, S: 16.74 m[IU]/mL

## 2012-08-12 MED ORDER — PANTOPRAZOLE SODIUM 40 MG PO TBEC: 40.0000 mg | DELAYED_RELEASE_TABLET | Freq: Every day | ORAL | Status: DC

## 2012-08-12 MED ORDER — ONDANSETRON 4 MG PO TBDP: 4.0000 mg | ORAL_TABLET | Freq: Four times a day (QID) | ORAL | Status: DC | PRN

## 2012-08-12 NOTE — Patient Instructions (Addendum)
Your physician has requested that you go to the basement for the following lab work before leaving today:  H pylori, CMET, CBC   We have sent the following medications to your pharmacy for you to pick up at your convenience:  Protonix, Zofran

## 2012-08-12 NOTE — Progress Notes (Signed)
i agree with the above plan 

## 2012-08-12 NOTE — Progress Notes (Signed)
Subjective:    Patient ID: Emma Gardner, female    DOB: 1985-11-30, 27 y.o.   MRN: [id number]  HPI Emma Gardner is a pleasant 27 year old African female known to Dr. Christella Hartigan. She has history of gastritis and H. pylori initially diagnosed in 2010 at the time of endoscopy. She was treated and her symptoms improved. She also had undergone abdominal ultrasound and gastric emptying scan in 2010 which were normal. She had a more recent diagnostic laparoscopy done for pelvic pain in May of 2014 which was negative. She was last seen by Dr. Christella Hartigan in March of 2014 at that time complaining of recurrent nausea and early morning vomiting. She had H. pylori antibody done prior to that visit which was positive and she stated that her symptoms felt similar to when she had H. pylori in the past. She  stated at that time did that she did not complete the full course of Pylera  previously and therefore she was treated again with a course of pyloric. She comes in today stating that she completed n felt better and really had no symptoms again until past month. She says now she is having recurrent nausea early in the morning's and usually vomits at least once every morning. The remainder of the day she gets nauseated intermittently but does not have any vomiting. Her appetite is fair ,her weight has been staying stable. She does not feel that she is pregnant and did a home pregnancy test about 2 weeks ago not having any problems with diarrhea melena hematochezia fever chills etc. and really denies any abdominal pain. Again she says she feels just like she did when she had the H. pylori in the past     Review of Systems  Constitutional: Positive for appetite change.  HENT: Negative.   Eyes: Negative.   Respiratory: Negative.   Cardiovascular: Negative.   Gastrointestinal: Positive for nausea and vomiting.  Endocrine: Negative.   Genitourinary: Negative.   Musculoskeletal: Negative.   Allergic/Immunologic: Negative.    Neurological: Negative.   Hematological: Negative.   Psychiatric/Behavioral: Negative.    Outpatient Prescriptions Prior to Visit  Medication Sig Dispense Refill  . ALPRAZolam (XANAX) 0.5 MG tablet Take 1 tablet (0.5 mg total) by mouth 3 (three) times daily as needed for sleep or anxiety.  30 tablet  1  . ibuprofen (ADVIL,MOTRIN) 200 MG tablet Take 200 mg by mouth every 6 (six) hours as needed for pain.      . Norethin Ace-Eth Estrad-FE (MICROGESTIN FE 1/20 PO) Take 1 tablet by mouth daily.      Marland Kitchen PARoxetine (PAXIL) 20 MG tablet Take 20 mg by mouth daily.       No facility-administered medications prior to visit.       Allergies  Allergen Reactions  . Percocet (Oxycodone-Acetaminophen) Nausea And Vomiting, Swelling and Other (See Comments)    Headaches onset 06/06/2012   Patient Active Problem List   Diagnosis Date Noted  . Pelvic pain 05/20/2012  . Anxiety state, unspecified   . Nausea alone 03/17/2012  . Situational stress 03/17/2012  . HEADACHE 01/15/2010  . ABDOMINAL PAIN -GENERALIZED 12/19/2008  . DIARRHEA 03/13/2008   History  Substance Use Topics  . Smoking status: Former Smoker    Types: Cigars    Quit date: 10/04/2010  . Smokeless tobacco: Never Used  . Alcohol Use: No   family history includes Dementia in her paternal grandfather and paternal grandmother and Depression in her maternal grandmother and mother.  Objective:  Physical Exam well developed young African American female in no acute distress, pleasant blood pressure 100/70 pulse 66 height 5 foot 6 weight 186. HEENT; nontraumatic normocephalic EOMI PERRLA sclera anicteric, Supple; no JVD, Cardiovascular; regular rate and rhythm with S1-S2 no murmur or gallop, Pulmonary; clear bilaterally, Abdomen soft bowel sounds are present he is nontender nondistended no palpable mass or hepatosplenomegaly, Rectal; exam not done, Extremities; no clubbing cyanosis or edema skin warm dry, Psych; mood and affect normal and  appropriate        Assessment & Plan:  #1 52 H. or old [place] American female with history of H. pylori positive gastritis treated in 2010 and treated again in March of 2014 for recurrent symptoms with positive H. pylori antibody. Patient returns now with recurrent symptoms over the past month stating that she definitely was improved with his last course of Pylera. She presents now with recurrent early morning nausea and vomiting and infrequent episodes of nausea throughout the remainder of the day without any abdominal pain. Etiology of her current symptoms is not clear unlikely that she is not clearing  Hpylori but this is certainly possible. Rule out recurrent gastritis peptic ulcer disease, functional dyspepsia, pregnancy . , Plan we'll check H. pylori stool antigen Check CBC with differential ,CMET,and beta hCG Start Zofran 4 mg every 6 hours as needed for nausea will give the orally disintegrating so she can use this quickly early in the morning Start Protonix 40 mg by mouth every morning If symptoms persistent H. pylori stool antigen and labs are unrevealing she may need repeat EGD with biopsies .

## 2012-08-13 ENCOUNTER — Other Ambulatory Visit: Payer: Self-pay | Admitting: *Deleted

## 2012-08-13 DIAGNOSIS — R112 Nausea with vomiting, unspecified: Secondary | ICD-10-CM

## 2012-08-16 ENCOUNTER — Ambulatory Visit: Payer: 59

## 2012-08-16 ENCOUNTER — Ambulatory Visit (INDEPENDENT_AMBULATORY_CARE_PROVIDER_SITE_OTHER): Payer: 59 | Admitting: Obstetrics & Gynecology

## 2012-08-16 ENCOUNTER — Other Ambulatory Visit (INDEPENDENT_AMBULATORY_CARE_PROVIDER_SITE_OTHER): Payer: 59

## 2012-08-16 ENCOUNTER — Encounter: Payer: Self-pay | Admitting: Internal Medicine

## 2012-08-16 ENCOUNTER — Ambulatory Visit (INDEPENDENT_AMBULATORY_CARE_PROVIDER_SITE_OTHER): Payer: 59 | Admitting: Internal Medicine

## 2012-08-16 VITALS — BP 104/78 | HR 67 | Temp 98.5°F | Wt 187.0 lb

## 2012-08-16 VITALS — BP 118/82 | HR 76 | Temp 98.6°F | Ht 66.0 in | Wt 186.0 lb

## 2012-08-16 DIAGNOSIS — J029 Acute pharyngitis, unspecified: Secondary | ICD-10-CM

## 2012-08-16 DIAGNOSIS — B9789 Other viral agents as the cause of diseases classified elsewhere: Secondary | ICD-10-CM

## 2012-08-16 DIAGNOSIS — J020 Streptococcal pharyngitis: Secondary | ICD-10-CM

## 2012-08-16 DIAGNOSIS — B349 Viral infection, unspecified: Secondary | ICD-10-CM

## 2012-08-16 DIAGNOSIS — R112 Nausea with vomiting, unspecified: Secondary | ICD-10-CM

## 2012-08-16 DIAGNOSIS — Z3200 Encounter for pregnancy test, result unknown: Secondary | ICD-10-CM

## 2012-08-16 DIAGNOSIS — R102 Pelvic and perineal pain: Secondary | ICD-10-CM

## 2012-08-16 LAB — POCT URINALYSIS DIPSTICK
Bilirubin, UA: NEGATIVE
Ketones, UA: NEGATIVE
Leukocytes, UA: NEGATIVE
Protein, UA: NEGATIVE

## 2012-08-16 LAB — HCG, QUANTITATIVE, PREGNANCY: hCG, Beta Chain, Quant, S: 5.12 m[IU]/mL

## 2012-08-16 MED ORDER — NAPROXEN 500 MG PO TABS: 500.0000 mg | ORAL_TABLET | Freq: Two times a day (BID) | ORAL | Status: DC

## 2012-08-16 NOTE — Progress Notes (Addendum)
Subjective:    Patient ID: Emma Gardner, female    DOB: March 28, 1985, 27 y.o.   MRN: [id number]  HPI: Patient presents today with a complaint of sore throat x 3 days. The sore throat is worse with swallowing. She has a sensation that there is something in her throat. The sore throat has been accompanied by body aches, sore neck, generalized fatigue, and loss of appetite. She was running a low grade fever accompanied by chills and night sweats 2 days ago. She has been taking ibuprofen with relief of the fever and associated symptoms. She denies ill contacts, headache, cough, shortness of breath, ear pain, sinus pressure, congestion, runny nose, and abdominal pain.   Past Medical History  Diagnosis Date  . Ovarian cyst   . Abnormal Pap smear   . Anxiety   . H. pylori infection 2008    ?reinfx - re-tx 03/2012 triple pk  . SVD (spontaneous vaginal delivery)     x 2  . Headache(784.0)     otc med prn    History  Substance Use Topics  . Smoking status: Former Smoker    Types: Cigars    Quit date: 10/04/2010  . Smokeless tobacco: Never Used  . Alcohol Use: No     Review of Systems  Gastrointestinal: Positive for nausea and constipation. Negative for vomiting and diarrhea.       Patient reports that she is being evaluated by GI for H. Pylori infection, and the nausea she is experiencing is not new. She has not had a bowel movement x 5 days.   Remainder of ROS is negative other than as stated in HPI.      Objective:   Physical Exam  Constitutional: She is oriented to person, place, and time. She appears well-developed and well-nourished.  HENT:  TM's pearly grey with normal cone of light bilaterally Tonsils and pharynx erythematous and edematous; no exudate noted  Eyes: Conjunctivae are normal. Pupils are equal, round, and reactive to light. Right eye exhibits discharge. Left eye exhibits discharge.  Neck: Normal range of motion. Neck supple. No tracheal deviation present. No  thyromegaly present.  Tender cervical adenopathy noted bilaterally. Negative for meningeal signs  Cardiovascular: Normal rate and regular rhythm.   Pulmonary/Chest: Breath sounds normal. She has no wheezes. She has no rales.  Abdominal: Soft. Bowel sounds are normal. There is no tenderness.  Lymphadenopathy:    She has cervical adenopathy.  Neurological: She is alert and oriented to person, place, and time.  Skin: Skin is warm and dry. No rash noted.  Psychiatric: She has a normal mood and affect.    Lab Results  Component Value Date   WBC 8.1 08/12/2012   HGB 12.6 08/12/2012   HCT 36.5 08/12/2012   PLT 305.0 08/12/2012   GLUCOSE 72 08/12/2012   CHOL 184 09/25/2011   TRIG 48.0 09/25/2011   HDL 57.50 09/25/2011   LDLCALC 117* 09/25/2011   ALT 17 08/12/2012   AST 19 08/12/2012   NA 138 08/12/2012   K 3.9 08/12/2012   CL 107 08/12/2012   CREATININE 0.8 08/12/2012   BUN 9 08/12/2012   CO2 27 08/12/2012   TSH 1.71 09/25/2011          Assessment & Plan:   1. Acute pharyngitis- Negative rapid strep test; throat swab will also be sent for culture. Most likely viral etiology; patient does not wish to have titers for EBV today. We will treat patient with supportive care- plenty of  rest and fluids, and naproxen for relief of fever symptoms. Patient is to follow up as needed if symptoms worsen or do not improve over next 1-2 weeks.   I have personally reviewed this case with PA student. I also personally examined this patient. I agree with history and findings as documented above. I reviewed, discussed and approve of the assessment and plan as listed above. Rene Paci, MD   Addendum: culture positive for group A strep - amoxicillin twice a day x10 days prescribed, patient notified of same

## 2012-08-16 NOTE — Patient Instructions (Signed)
It was good to see you today. Rapid strep test is negative, but will send for culture to confirm or deny bacterial infection Your flulike symptoms are likely related to a viral illness such as mono - symptomatic care is advised including anti-inflammatories twice daily with food as needed for aches and pain, plenty of hydration and lots of rest Call if symptoms unimproved in next 2 weeks, sooner if worse  Viral Infections A viral infection can be caused by different types of viruses.Most viral infections are not serious and resolve on their own. However, some infections may cause severe symptoms and may lead to further complications. SYMPTOMS Viruses can frequently cause:  Minor sore throat.  Aches and pains.  Headaches.  Runny nose.  Different types of rashes.  Watery eyes.  Tiredness.  Cough.  Loss of appetite.  Gastrointestinal infections, resulting in nausea, vomiting, and diarrhea. These symptoms do not respond to antibiotics because the infection is not caused by bacteria. However, you might catch a bacterial infection following the viral infection. This is sometimes called a "superinfection." Symptoms of such a bacterial infection may include:  Worsening sore throat with pus and difficulty swallowing.  Swollen neck glands.  Chills and a high or persistent fever.  Severe headache.  Tenderness over the sinuses.  Persistent overall ill feeling (malaise), muscle aches, and tiredness (fatigue).  Persistent cough.  Yellow, green, or brown mucus production with coughing. HOME CARE INSTRUCTIONS   Only take over-the-counter or prescription medicines for pain, discomfort, diarrhea, or fever as directed by your caregiver.  Drink enough water and fluids to keep your urine clear or pale yellow. Sports drinks can provide valuable electrolytes, sugars, and hydration.  Get plenty of rest and maintain proper nutrition. Soups and broths with crackers or rice are fine. SEEK  IMMEDIATE MEDICAL CARE IF:   You have severe headaches, shortness of breath, chest pain, neck pain, or an unusual rash.  You have uncontrolled vomiting, diarrhea, or you are unable to keep down fluids.  You or your child has an oral temperature above 102 F (38.9 C), not controlled by medicine.  Your baby is older than 3 months with a rectal temperature of 102 F (38.9 C) or higher.  Your baby is 71 months old or younger with a rectal temperature of 100.4 F (38 C) or higher. MAKE SURE YOU:   Understand these instructions.  Will watch your condition.  Will get help right away if you are not doing well or get worse. Document Released: 10/23/2004 Document Revised: 04/07/2011 Document Reviewed: 05/20/2010 [facility] Patient Information 2014 [place], [place]. Viral and Bacterial Pharyngitis Pharyngitis is a sore throat. It is an infection of the back of the throat (pharynx). HOME CARE   Only take medicine as told by your doctor. You may get sick again if you do not take medicine as told.  Drink enough fluids to keep your pee (urine) clear or pale yellow.  Rest.  Rinse your mouth (gargle) with salt water ( teaspoon of salt in 8 ounces of water) every 1 to 2 hours. This will help the pain.  For children over the age of 7, suck on hard candy or sore throat lozenges. GET HELP RIGHT AWAY IF:   There are large, tender lumps in your neck.  You have a rash.  You cough up green, yellow-brown, or bloody mucus.  You have a stiff neck.  There is redness, puffiness (swelling), or very bad pain anywhere on the neck.  You drool or are  unable to swallow liquids.  You throw up (vomit) or are not able to keep medicine or liquids down.  You have very bad pain that will not stop with medicine.  You have problems breathing (not from a stuffy nose).  You cannot open your mouth completely.  You or your child has a temperature by mouth above 102 F (38.9 C), not controlled by  medicine.  Your baby is older than 3 months with a rectal temperature of 102 F (38.9 C) or higher.  Your baby is 67 months old or younger with a rectal temperature of 100.4 F (38 C) or higher. MAKE SURE YOU:   Understand these instructions.  Will watch this condition.  Will get help right away if you or your child is not doing well or gets worse. Document Released: 07/02/2007 Document Revised: 04/07/2011 Document Reviewed: 02/12/2009 [facility] Patient Information 2014 [place], [place]. Infectious Mononucleosis Infectious mononucleosis (mono) is a common viral infection. You can get mono from close contact with someone who is infected. If you have mono, you may have a sore throat, headache, feel tired, or have a fever. HOME CARE  Drink enough fluids to keep your pee (urine) clear or pale yellow.  Eat soft foods. Eat cold foods (frozen ice pops, ice cream) to make your throat feel better.  Only take medicines as told by your doctor. Do not give aspirin to children.  Rest as needed. Have someone with you to make sure you do not get worse.  Do not  play contact sports. Avoid activities where you could get hurt for 3 to 4 weeks. The organ that cleans your blood (spleen) might be puffy (swollen), and it could get hurt.  Wash your hands or use hand sanitizer often. Avoid kissing or sharing your drinking glass until your doctor says you are better.  Keep all follow-up visits as told by your doctor. GET HELP RIGHT AWAY IF:   You have strong pain in your stomach or shoulder.  You have trouble breathing or swallowing.  You are confused.  You get a strong headache or stiff neck.  You are shaking (convulsing).  You keep throwing up (vomiting).  You are weak, with pale skin, dry mouth, and fast heartbeat.  Your fever does not go away after 7 days.  You cannot return to normal activities after 2 weeks.  You have yellow color in the eyes and skin (jaundice). MAKE SURE YOU:    Understand these instructions.  Will watch your condition.  Will get help right away if you are not doing well or get worse. Document Released: 01/01/2009 Document Revised: 04/07/2011 Document Reviewed: 01/01/2009 [facility] Patient Information 2014 [place], [place].

## 2012-08-16 NOTE — Addendum Note (Signed)
Addended by: Deatra James on: 08/16/2012 03:17 PM   Modules accepted: Orders

## 2012-08-16 NOTE — Progress Notes (Signed)
Subjective:     Emma Gardner is a 27 y.o. female here for a routine exam.  Current complaints: pelvic pain. Pt states she was recently in the urgent care for an appointment because she has been having nausea. Pt does have a history of H. Pylori. Pt states she was given a pregnancy test and esa informed that it is slightly positive. Pt states she started having moderate cramping Saturday. Pt states she had started bleeding 08-15-12 and has been a light flow.  Personal health questionnaire reviewed: yes.   Gynecologic History Patient's last menstrual period was 08/15/2012. Contraception: none Last Pap: 2013. Results were: normal Last mammogram: n/a. Results were: n/a  Obstetric History OB History   Grav Para Term Preterm Abortions TAB SAB Ect Mult Living   2 2 2       2      # Outc Date GA Lbr Len/2nd Wgt Sex Del Anes PTL Lv   1 TRM            2 TRM                The following portions of the patient's history were reviewed and updated as appropriate: allergies, current medications, past family history, past medical history, past social history, past surgical history and problem list.  Review of Systems Pertinent items are noted in HPI.    Objective:   General:  alert     Abdomen: soft, non-tender; bowel sounds normal; no masses,  no organomegaly   Vulva:  normal  Vagina: normal vagina  Cervix:  no lesions  Corpus: normal size, contour, position, consistency, mobility, non-tender  Adnexa:  normal adnexa    Assessment:    Chronic pain--doubt gynecologic etiology H/O [facility] - Inpatient treatment w/o improvement   Plan:   Continue to explore other etiologies of pain w/primary care provider Consider a neuromodulator Return in 6 mths

## 2012-08-17 ENCOUNTER — Encounter: Payer: Self-pay | Admitting: Obstetrics & Gynecology

## 2012-08-18 ENCOUNTER — Ambulatory Visit: Payer: 59 | Admitting: Obstetrics & Gynecology

## 2012-08-18 LAB — CULTURE, GROUP A STREP

## 2012-08-18 MED ORDER — AMOXICILLIN 875 MG PO TABS: 875.0000 mg | ORAL_TABLET | Freq: Two times a day (BID) | ORAL | Status: DC

## 2012-08-18 NOTE — Addendum Note (Signed)
Addended by: Rene Paci A on: 08/18/2012 08:27 AM   Modules accepted: Orders

## 2012-11-01 ENCOUNTER — Inpatient Hospital Stay (HOSPITAL_COMMUNITY)
Admission: AD | Admit: 2012-11-01 | Discharge: 2012-11-01 | Discharge status: Against Medical Advice | Payer: 59 | Source: Ambulatory Visit | Attending: Obstetrics & Gynecology | Admitting: Obstetrics & Gynecology

## 2012-11-01 NOTE — MAU Note (Signed)
Patient is not in the lobby when called to triage.  

## 2012-11-05 ENCOUNTER — Ambulatory Visit: Payer: 59 | Admitting: *Deleted

## 2012-11-05 ENCOUNTER — Encounter: Payer: 59 | Admitting: Obstetrics & Gynecology

## 2012-11-05 ENCOUNTER — Encounter: Payer: Self-pay | Admitting: Obstetrics & Gynecology

## 2012-11-05 VITALS — BP 121/72 | HR 72 | Temp 97.5°F | Ht 66.0 in | Wt 190.0 lb

## 2012-11-05 DIAGNOSIS — Z3201 Encounter for pregnancy test, result positive: Secondary | ICD-10-CM

## 2012-11-05 LAB — POCT URINE PREGNANCY: Preg Test, Ur: POSITIVE

## 2012-11-05 NOTE — Progress Notes (Signed)
Pt in office today for a confirmation of pregnancy test. Pt states she had a positive home pregnancy test.  Pregnancy test in office was positive. Pt instructed to make a NOB appointment. Pt given samples of prenatal vitamins.

## 2012-11-08 ENCOUNTER — Ambulatory Visit: Payer: 59

## 2012-11-25 ENCOUNTER — Encounter: Payer: 59 | Admitting: Obstetrics & Gynecology

## 2012-11-29 ENCOUNTER — Encounter: Payer: 59 | Admitting: Obstetrics & Gynecology

## 2012-12-14 ENCOUNTER — Encounter (HOSPITAL_COMMUNITY): Payer: Self-pay

## 2012-12-14 ENCOUNTER — Inpatient Hospital Stay (HOSPITAL_COMMUNITY)
Admission: AD | Admit: 2012-12-14 | Discharge: 2012-12-14 | Disposition: A | Discharge status: Home / Self Care | Payer: Medicaid Other | Source: Ambulatory Visit | Attending: Obstetrics | Admitting: Obstetrics

## 2012-12-14 DIAGNOSIS — R51 Headache: Secondary | ICD-10-CM | POA: Insufficient documentation

## 2012-12-14 DIAGNOSIS — O210 Mild hyperemesis gravidarum: Secondary | ICD-10-CM | POA: Insufficient documentation

## 2012-12-14 DIAGNOSIS — K5900 Constipation, unspecified: Secondary | ICD-10-CM

## 2012-12-14 LAB — URINALYSIS, ROUTINE W REFLEX MICROSCOPIC
Bilirubin Urine: NEGATIVE
Leukocytes, UA: NEGATIVE
Nitrite: NEGATIVE
Specific Gravity, Urine: 1.025 (ref 1.005–1.030)
Urobilinogen, UA: 0.2 mg/dL (ref 0.0–1.0)
pH: 6.5 (ref 5.0–8.0)

## 2012-12-14 MED ORDER — PROMETHAZINE HCL 25 MG PO TABS
12.5000 mg | ORAL_TABLET | Freq: Once | ORAL | Status: AC
  Administered 2012-12-14: 12.5 mg via ORAL
  Filled 2012-12-14: qty 1

## 2012-12-14 MED ORDER — ACETAMINOPHEN 500 MG PO TABS
1000.0000 mg | ORAL_TABLET | Freq: Once | ORAL | Status: AC
  Administered 2012-12-14: 1000 mg via ORAL
  Filled 2012-12-14: qty 2

## 2012-12-14 NOTE — MAU Note (Signed)
Patient is in with c/o headache (history of headache, not on any medications), nausea. She states that she does not like to take zofran (that was prescribes for her because it makes her feel weird, last dose 3 weeks ago) and constipation. She states that she last had a normal bowel movement 2 weeks ago. She denies vaginal bleeding or abnormal discharge. fht 151-158. She states that she have not gone to dr Clearance Coots except for pregnancy test because she does not have her medicaid card yet.

## 2012-12-14 NOTE — MAU Provider Note (Signed)
Subjective:    Ms Emma Gardner is a 27yof who presents with nausea and vomiting. Pt is G3P2002 and states she has experienced morning sickness during this pregnancy which typically resolves by the afternoon.  She states that for the past 2 days, her nausea has worsened to the point that she feels she cannot keep any food down.  [name] also reports constipation x 2wks with only a few small, hard stools during that period of time.  [name] tried taking OTC stool softener x1 on Saturday, however noted no improvement.  [name] c/o a headache today which is bilateral in the frontal aspect of her head, and she describes it as being different and less severe than the migraines she experienced in the past.  She reports having minor dizziness upon standing which resolves spontaneously.  She denies blurred vision, abd pain, vaginal bleeding/discharge.    Patient Active Problem List   Diagnosis Date Noted  . Pelvic pain 05/20/2012  . Anxiety state, unspecified   . Nausea alone 03/17/2012  . Situational stress 03/17/2012  . HEADACHE 01/15/2010  . ABDOMINAL PAIN -GENERALIZED 12/19/2008  . DIARRHEA 03/13/2008   Past Medical History  Diagnosis Date  . Ovarian cyst   . Abnormal Pap smear   . Anxiety   . H. pylori infection 2008    ?reinfx - re-tx 03/2012 triple pk  . SVD (spontaneous vaginal delivery)     x 2  . Headache(784.0)     otc med prn  . Ovarian cyst     Past Surgical History  Procedure Laterality Date  . Endometrial biopsy      normal  . Cervical biopsy    . Essure tubal ligation  07/2011  . Tubal ligation    . Laparoscopy N/A 06/04/2012    Procedure: LAPAROSCOPY DIAGNOSTIC;  Surgeon: Antionette Char, MD;  Location: [facility];  Service: Gynecology;  Laterality: N/A;    Prescriptions prior to admission  Medication Sig Dispense Refill  . docusate sodium (COLACE) 100 MG capsule Take 200 mg by mouth once.      Marland Kitchen ibuprofen (ADVIL,MOTRIN) 200 MG tablet Take 400 mg by mouth daily as needed  for moderate pain.       Allergies  Allergen Reactions  . Percocet [Oxycodone-Acetaminophen] Nausea And Vomiting, Swelling and Other (See Comments)    Headaches onset 06/06/2012    History  Substance Use Topics  . Smoking status: Former Smoker    Types: Cigars    Quit date: 10/04/2010  . Smokeless tobacco: Never Used  . Alcohol Use: No    Family History  Problem Relation Age of Onset  . Dementia Paternal Grandfather   . Dementia Paternal Grandmother   . Depression Mother   . Depression Maternal Grandmother     Review of Systems Pertinent items are noted in HPI.  Objective:   Patient Vitals for the past 8 hrs:  BP Temp Temp src Pulse Resp SpO2 Height Weight  12/14/12 1332 118/70 mmHg 98.7 F (37.1 C) Oral 58 16 100 % - -  12/14/12 1258 - - - - - - 5\' 6"  (1.676 m) 200 lb (90.719 kg)   BP 118/70  Pulse 58  Temp(Src) 98.7 F (37.1 C) (Oral)  Resp 16  Ht 5\' 6"  (1.676 m)  Wt 200 lb (90.719 kg)  BMI 32.30 kg/m2  SpO2 100%  LMP 09/01/2012  General Appearance:    Alert, cooperative, no distress, appears stated age  Head:    Normocephalic, without obvious abnormality,  atraumatic       Heart:    Regular rate and rhythm, S1 and S2 normal, no murmur, rub   or gallop     Abdomen:     Soft, non-tender, bowel sounds active all four quadrants,    no masses, no organomegaly.  Fundus height noted just above pubic symphysis                                                 1529: Patient refuses enema   She has had tylenol and phenergan, and is feeling better.  Assessment:   1. Nausea/Vomiting 2. Constipation 3. Headache   Plan:   1. Phenergan 12.5mg  PO 2. Talk to pt about taking Colace BID  3. Tylenol 350mg  PO

## 2013-01-17 ENCOUNTER — Ambulatory Visit (INDEPENDENT_AMBULATORY_CARE_PROVIDER_SITE_OTHER): Payer: Medicaid Other | Admitting: Advanced Practice Midwife

## 2013-01-17 ENCOUNTER — Encounter: Payer: Self-pay | Admitting: Advanced Practice Midwife

## 2013-01-17 VITALS — BP 126/65 | Temp 97.4°F | Wt 209.0 lb

## 2013-01-17 DIAGNOSIS — Z3480 Encounter for supervision of other normal pregnancy, unspecified trimester: Secondary | ICD-10-CM | POA: Insufficient documentation

## 2013-01-17 DIAGNOSIS — Z3482 Encounter for supervision of other normal pregnancy, second trimester: Secondary | ICD-10-CM

## 2013-01-17 DIAGNOSIS — Z3201 Encounter for pregnancy test, result positive: Secondary | ICD-10-CM

## 2013-01-17 LAB — OB RESULTS CONSOLE GC/CHLAMYDIA
CHLAMYDIA, DNA PROBE: NEGATIVE
Gonorrhea: NEGATIVE

## 2013-01-17 NOTE — Progress Notes (Signed)
Pulse- 82  Subjective:    Emma Gardner is being seen today for her first obstetrical visit.  This is not a planned pregnancy. She is at [redacted]w[redacted]d gestation. Her obstetrical history is significant for none. Relationship with FOB: significant other, living together. Patient does not intend to breast feed. Pregnancy history fully reviewed.  Patient had Essure procedure done, they were not able to occlude the left tube, she never followed up for a tubal ligation and is now pregnant.   Patient had pap smear in 2012 that was WNL, no current concerns of infection. Had annual exam earlier in the year at other practice.   Menstrual History: OB History   Grav Para Term Preterm Abortions TAB SAB Ect Mult Living   3 2 2       2       Last Pap: 07/ 2014 WNL Menarche age: 79  Patient's last menstrual period was 09/01/2012.    The following portions of the patient's history were reviewed and updated as appropriate: allergies, current medications, past family history, past medical history, past social history, past surgical history and problem list.  Review of Systems A comprehensive review of systems was negative.    Objective:    BP 126/65  Temp(Src) 97.4 F (36.3 C)  Wt 209 lb (94.802 kg)  LMP 09/01/2012  General Appearance:    Alert, cooperative, no distress, appears stated age  Head:    Normocephalic, without obvious abnormality, atraumatic  Eyes:    PERRL, conjunctiva/corneas clear, EOM's intact, fundi    benign, both eyes  Ears:    Normal TM's and external ear canals, both ears  Nose:   Nares normal, septum midline, mucosa normal, no drainage    or sinus tenderness  Throat:   Lips, mucosa, and tongue normal; teeth and gums normal  Neck:   Supple, symmetrical, trachea midline, no adenopathy;    thyroid:  no enlargement/tenderness/nodules; no carotid   bruit or JVD  Back:     Symmetric, no curvature, ROM normal, no CVA tenderness  Lungs:     Clear to auscultation bilaterally,  respirations unlabored  Chest Wall:    No tenderness or deformity   Heart:    Regular rate and rhythm, S1 and S2 normal, no murmur, rub   or gallop  Breast Exam:    No tenderness, masses, or nipple abnormality  Abdomen:     Soft, non-tender, bowel sounds active all four quadrants,    no masses, no organomegaly        Extremities:   Extremities normal, atraumatic, no cyanosis or edema  Pulses:   2+ and symmetric all extremities  Skin:   Skin color, texture, turgor normal, no rashes or lesions  Lymph nodes:   Cervical, supraclavicular, and axillary nodes normal  Neurologic:   CNII-XII intact, normal strength, sensation and reflexes    throughout      Assessment:    Pregnancy at [redacted]w[redacted]d weeks  Establishing Care  Essure in Right Fallopian Tube [place] pending for confirmation of due date    Plan:    Initial labs drawn. Prenatal vitamins.  Counseling provided regarding continued use of seat belts, cessation of alcohol consumption, smoking or use of illicit drugs; infection precautions i.e., influenza/TDAP immunizations, toxoplasmosis,CMV, parvovirus, listeria and varicella; workplace safety, exercise during pregnancy; routine dental care, safe medications, sexual activity, hot tubs, saunas, pools, travel, caffeine use, fish and methlymercury, potential toxins, hair treatments, varicose veins Weight gain recommendations per IOM guidelines reviewed: underweight/BMI< 18.5--> gain  28 - 40 lbs; normal weight/BMI 18.5 - 24.9--> gain 25 - 35 lbs; overweight/BMI 25 - 29.9--> gain 15 - 25 lbs; obese/BMI >30->gain  11 - 20 lbs Problem list reviewed and updated. FIRST/CF mutation testing/NIPT/QUAD SCREEN discussed: requested. Role of ultrasound in pregnancy discussed; fetal survey: requested. Amniocentesis discussed: not indicated. Follow up in 4 weeks. 80% of 40 min visit spent on counseling and coordination of care.   Marshia Tropea Wilson Singer CNM

## 2013-01-18 LAB — OBSTETRIC PANEL
Antibody Screen: NEGATIVE
Basophils Absolute: 0 10*3/uL (ref 0.0–0.1)
Basophils Relative: 0 % (ref 0–1)
Eosinophils Absolute: 0.1 10*3/uL (ref 0.0–0.7)
Eosinophils Relative: 1 % (ref 0–5)
HCT: 33.8 % — ABNORMAL LOW (ref 36.0–46.0)
Hemoglobin: 11.6 g/dL — ABNORMAL LOW (ref 12.0–15.0)
Lymphocytes Relative: 26 % (ref 12–46)
MCH: 31.6 pg (ref 26.0–34.0)
MCHC: 34.3 g/dL (ref 30.0–36.0)
Monocytes Absolute: 0.8 10*3/uL (ref 0.1–1.0)
Monocytes Relative: 7 % (ref 3–12)
Neutro Abs: 7.1 10*3/uL (ref 1.7–7.7)
RDW: 14 % (ref 11.5–15.5)
Rh Type: POSITIVE
WBC: 10.8 10*3/uL — ABNORMAL HIGH (ref 4.0–10.5)

## 2013-01-18 LAB — HIV ANTIBODY (ROUTINE TESTING W REFLEX): HIV: NONREACTIVE

## 2013-01-18 LAB — GC/CHLAMYDIA PROBE AMP
CT Probe RNA: NEGATIVE
GC Probe RNA: NEGATIVE

## 2013-01-19 LAB — AFP, QUAD SCREEN
HCG, Total: 27216 m[IU]/mL
INH: 141.8 pg/mL
Interpretation-AFP: NEGATIVE
MoM for AFP: 1.22
MoM for hCG: 2.04
Open Spina bifida: NEGATIVE
Tri 18 Scr Risk Est: NEGATIVE
uE3 Mom: 0.61
uE3 Value: 0.7 ng/mL

## 2013-01-19 LAB — CULTURE, OB URINE: Colony Count: NO GROWTH

## 2013-01-19 LAB — HEMOGLOBINOPATHY EVALUATION
Hgb A2 Quant: 2.6 % (ref 2.2–3.2)
Hgb A: 97.4 % (ref 96.8–97.8)
Hgb F Quant: 0 % (ref 0.0–2.0)

## 2013-01-25 ENCOUNTER — Other Ambulatory Visit: Payer: Self-pay | Admitting: *Deleted

## 2013-01-25 ENCOUNTER — Ambulatory Visit (INDEPENDENT_AMBULATORY_CARE_PROVIDER_SITE_OTHER): Payer: Medicaid Other

## 2013-01-25 DIAGNOSIS — O365990 Maternal care for other known or suspected poor fetal growth, unspecified trimester, not applicable or unspecified: Secondary | ICD-10-CM

## 2013-01-25 DIAGNOSIS — Z1389 Encounter for screening for other disorder: Secondary | ICD-10-CM

## 2013-01-26 ENCOUNTER — Encounter: Payer: Self-pay | Admitting: Obstetrics & Gynecology

## 2013-01-26 LAB — US OB DETAIL + 14 WK

## 2013-01-27 NOTE — L&D Delivery Note (Signed)
Delivery Note At 9:43 PM a viable female was delivered via  (Presentation: ; Left Occiput Anterior-->Right Occiput Posterior).  APGAR: 9, 9; weight 7 lb 7.2 oz (3380 g).   Placenta status: delivered with cord traction via Tomasa Blase, .  Cord: 3 vessels with the following complications: None.    Anesthesia: Epidural, local Episiotomy: None Lacerations: labial Suture Repair: 3.0 vicryl rapide Est. Blood Loss (mL): 300 ml  Mom to postpartum.  Baby to Couplet care / Skin to Skin.  Antionette Char 06/22/2013, 10:17 PM

## 2013-01-31 ENCOUNTER — Other Ambulatory Visit: Payer: Self-pay | Admitting: Advanced Practice Midwife

## 2013-01-31 ENCOUNTER — Encounter: Payer: Self-pay | Admitting: Obstetrics & Gynecology

## 2013-01-31 DIAGNOSIS — Z3480 Encounter for supervision of other normal pregnancy, unspecified trimester: Secondary | ICD-10-CM

## 2013-01-31 LAB — US OB DETAIL + 14 WK

## 2013-02-03 ENCOUNTER — Other Ambulatory Visit: Payer: Self-pay | Admitting: Advanced Practice Midwife

## 2013-02-03 ENCOUNTER — Encounter: Payer: Self-pay | Admitting: Advanced Practice Midwife

## 2013-02-03 ENCOUNTER — Ambulatory Visit (HOSPITAL_COMMUNITY)
Admission: RE | Admit: 2013-02-03 | Discharge: 2013-02-03 | Disposition: A | Discharge status: Home / Self Care | Payer: Medicaid Other | Source: Ambulatory Visit | Attending: Advanced Practice Midwife | Admitting: Advanced Practice Midwife

## 2013-02-03 DIAGNOSIS — Z3480 Encounter for supervision of other normal pregnancy, unspecified trimester: Secondary | ICD-10-CM

## 2013-02-03 DIAGNOSIS — Z3689 Encounter for other specified antenatal screening: Secondary | ICD-10-CM | POA: Insufficient documentation

## 2013-02-03 LAB — US OB DETAIL + 14 WK

## 2013-02-16 ENCOUNTER — Encounter: Payer: Medicaid Other | Admitting: Obstetrics & Gynecology

## 2013-02-21 ENCOUNTER — Ambulatory Visit (INDEPENDENT_AMBULATORY_CARE_PROVIDER_SITE_OTHER): Payer: Medicaid Other | Admitting: Obstetrics & Gynecology

## 2013-02-21 VITALS — BP 127/73 | Temp 98.6°F | Wt 221.0 lb

## 2013-02-21 DIAGNOSIS — O4702 False labor before 37 completed weeks of gestation, second trimester: Secondary | ICD-10-CM

## 2013-02-21 DIAGNOSIS — O4700 False labor before 37 completed weeks of gestation, unspecified trimester: Secondary | ICD-10-CM

## 2013-02-21 DIAGNOSIS — Z3480 Encounter for supervision of other normal pregnancy, unspecified trimester: Secondary | ICD-10-CM

## 2013-02-21 LAB — POCT URINALYSIS DIPSTICK
BILIRUBIN UA: NEGATIVE
GLUCOSE UA: NEGATIVE
Ketones, UA: NEGATIVE
LEUKOCYTES UA: NEGATIVE
NITRITE UA: NEGATIVE
Protein, UA: NEGATIVE
RBC UA: NEGATIVE
Spec Grav, UA: 1.02
Urobilinogen, UA: NEGATIVE
pH, UA: 5

## 2013-02-21 NOTE — Patient Instructions (Signed)
Glucose Tolerance Test This is a test to see how your body processes carbohydrates. This test is often done to check patients for diabetes or the possibility of developing it. PREPARATION FOR TEST You should have nothing to eat or drink 12 hours before the test. You will be given a form of sugar (glucose) and then blood samples will be drawn from your vein to determine the level of sugar in your blood. Alternatively, blood may be drawn from your finger for testing. You should not smoke or exercise during the test. NORMAL FINDINGS  Fasting: 70-115 mg/dL  30 minutes: less than 200 mg/dL  1 hour: less than 200 mg/dL  2 hours: less than 140 mg/dL  3 hours: 70-115 mg/dL  4 hours: 70-115 mg/dL Ranges for normal findings may vary among different laboratories and hospitals. You should always check with your doctor after having lab work or other tests done to discuss the meaning of your test results and whether your values are considered within normal limits. MEANING OF TEST Your caregiver will go over the test results with you and discuss the importance and meaning of your results, as well as treatment options and the need for additional tests. OBTAINING THE TEST RESULTS It is your responsibility to obtain your test results. Ask the lab or department performing the test when and how you will get your results. Document Released: 02/06/2004 Document Revised: 04/07/2011 Document Reviewed: 12/25/2007 [employer] Patient Information 2014 [employer].  

## 2013-02-21 NOTE — Progress Notes (Signed)
Pulse: 89 Patient states she is having some lower abdominal pressure. Patient states there are no concerns.

## 2013-02-21 NOTE — Addendum Note (Signed)
Addended by: Marya LandryFOSTER, Virgene Tirone D on: 02/21/2013 02:57 PM   Modules accepted: Orders

## 2013-02-22 LAB — WET PREP BY MOLECULAR PROBE
Candida species: NEGATIVE
GARDNERELLA VAGINALIS: NEGATIVE
TRICHOMONAS VAG: NEGATIVE

## 2013-02-28 ENCOUNTER — Ambulatory Visit (INDEPENDENT_AMBULATORY_CARE_PROVIDER_SITE_OTHER): Payer: Medicaid Other | Admitting: Obstetrics & Gynecology

## 2013-02-28 ENCOUNTER — Other Ambulatory Visit: Payer: Medicaid Other

## 2013-02-28 VITALS — BP 125/80 | Temp 97.5°F | Wt 225.0 lb

## 2013-02-28 DIAGNOSIS — Z3480 Encounter for supervision of other normal pregnancy, unspecified trimester: Secondary | ICD-10-CM

## 2013-02-28 LAB — POCT URINALYSIS DIPSTICK
Bilirubin, UA: NEGATIVE
Glucose, UA: NEGATIVE
Ketones, UA: NEGATIVE
Leukocytes, UA: NEGATIVE
NITRITE UA: NEGATIVE
Protein, UA: NEGATIVE
RBC UA: NEGATIVE
Spec Grav, UA: 1.02
UROBILINOGEN UA: NEGATIVE
pH, UA: 5

## 2013-02-28 NOTE — Progress Notes (Signed)
Pulse- 96 Patient states is having some lower abdomen pressure.

## 2013-03-01 ENCOUNTER — Encounter: Payer: Self-pay | Admitting: Obstetrics & Gynecology

## 2013-03-02 NOTE — Patient Instructions (Signed)
Second Trimester of Pregnancy The second trimester is from week 13 through week 28, months 4 through 6. The second trimester is often a time when you feel your best. Your body has also adjusted to being pregnant, and you begin to feel better physically. Usually, morning sickness has lessened or quit completely, you may have more energy, and you may have an increase in appetite. The second trimester is also a time when the fetus is growing rapidly. At the end of the sixth month, the fetus is about 9 inches long and weighs about 1 pounds. You will likely begin to feel the baby move (quickening) between 18 and 20 weeks of the pregnancy. BODY CHANGES Your body goes through many changes during pregnancy. The changes vary from woman to woman.   Your weight will continue to increase. You will notice your lower abdomen bulging out.  You may begin to get stretch marks on your hips, abdomen, and breasts.  You may develop headaches that can be relieved by medicines approved by your caregiver.  You may urinate more often because the fetus is pressing on your bladder.  You may develop or continue to have heartburn as a result of your pregnancy.  You may develop constipation because certain hormones are causing the muscles that push waste through your intestines to slow down.  You may develop hemorrhoids or swollen, bulging veins (varicose veins).  You may have back pain because of the weight gain and pregnancy hormones relaxing your joints between the bones in your pelvis and as a result of a shift in weight and the muscles that support your balance.  Your breasts will continue to grow and be tender.  Your gums may bleed and may be sensitive to brushing and flossing.  Dark spots or blotches (chloasma, mask of pregnancy) may develop on your face. This will likely fade after the baby is born.  A dark line from your belly button to the pubic area (linea nigra) may appear. This will likely fade after the  baby is born. WHAT TO EXPECT AT YOUR PRENATAL VISITS During a routine prenatal visit:  You will be weighed to make sure you and the fetus are growing normally.  Your blood pressure will be taken.  Your abdomen will be measured to track your baby's growth.  The fetal heartbeat will be listened to.  Any test results from the previous visit will be discussed. Your caregiver may ask you:  How you are feeling.  If you are feeling the baby move.  If you have had any abnormal symptoms, such as leaking fluid, bleeding, severe headaches, or abdominal cramping.  If you have any questions. Other tests that may be performed during your second trimester include:  Blood tests that check for:  Low iron levels (anemia).  Gestational diabetes (between 24 and 28 weeks).  Rh antibodies.  Urine tests to check for infections, diabetes, or protein in the urine.  An ultrasound to confirm the proper growth and development of the baby.  An amniocentesis to check for possible genetic problems.  Fetal screens for spina bifida and Down syndrome. HOME CARE INSTRUCTIONS   Avoid all smoking, herbs, alcohol, and unprescribed drugs. These chemicals affect the formation and growth of the baby.  Follow your caregiver's instructions regarding medicine use. There are medicines that are either safe or unsafe to take during pregnancy.  Exercise only as directed by your caregiver. Experiencing uterine cramps is a good sign to stop exercising.  Continue to eat regular,   healthy meals.  Wear a good support bra for breast tenderness.  Do not use hot tubs, steam rooms, or saunas.  Wear your seat belt at all times when driving.  Avoid raw meat, uncooked cheese, cat litter boxes, and soil used by cats. These carry germs that can cause birth defects in the baby.  Take your prenatal vitamins.  Try taking a stool softener (if your caregiver approves) if you develop constipation. Eat more high-fiber foods,  such as fresh vegetables or fruit and whole grains. Drink plenty of fluids to keep your urine clear or pale yellow.  Take warm sitz baths to soothe any pain or discomfort caused by hemorrhoids. Use hemorrhoid cream if your caregiver approves.  If you develop varicose veins, wear support hose. Elevate your feet for 15 minutes, 3 4 times a day. Limit salt in your diet.  Avoid heavy lifting, wear low heel shoes, and practice good posture.  Rest with your legs elevated if you have leg cramps or low back pain.  Visit your dentist if you have not gone yet during your pregnancy. Use a soft toothbrush to brush your teeth and be gentle when you floss.  A sexual relationship may be continued unless your caregiver directs you otherwise.  Continue to go to all your prenatal visits as directed by your caregiver. SEEK MEDICAL CARE IF:   You have dizziness.  You have mild pelvic cramps, pelvic pressure, or nagging pain in the abdominal area.  You have persistent nausea, vomiting, or diarrhea.  You have a bad smelling vaginal discharge.  You have pain with urination. SEEK IMMEDIATE MEDICAL CARE IF:   You have a fever.  You are leaking fluid from your vagina.  You have spotting or bleeding from your vagina.  You have severe abdominal cramping or pain.  You have rapid weight gain or loss.  You have shortness of breath with chest pain.  You notice sudden or extreme swelling of your face, hands, ankles, feet, or legs.  You have not felt your baby move in over an hour.  You have severe headaches that do not go away with medicine.  You have vision changes. Document Released: 01/07/2001 Document Revised: 09/15/2012 Document Reviewed: 03/16/2012 [employer] Patient Information 2014 [employer].  

## 2013-03-07 ENCOUNTER — Other Ambulatory Visit: Payer: Medicaid Other

## 2013-03-07 DIAGNOSIS — Z3480 Encounter for supervision of other normal pregnancy, unspecified trimester: Secondary | ICD-10-CM

## 2013-03-07 LAB — CBC
HCT: 32.9 % — ABNORMAL LOW (ref 36.0–46.0)
HEMOGLOBIN: 11.1 g/dL — AB (ref 12.0–15.0)
MCH: 31.7 pg (ref 26.0–34.0)
MCHC: 33.7 g/dL (ref 30.0–36.0)
MCV: 94 fL (ref 78.0–100.0)
Platelets: 345 10*3/uL (ref 150–400)
RBC: 3.5 MIL/uL — ABNORMAL LOW (ref 3.87–5.11)
RDW: 14.1 % (ref 11.5–15.5)
WBC: 13.5 10*3/uL — ABNORMAL HIGH (ref 4.0–10.5)

## 2013-03-08 LAB — GLUCOSE TOLERANCE, 2 HOURS W/ 1HR
Glucose, 1 hour: 126 mg/dL (ref 70–170)
Glucose, 2 hour: 61 mg/dL — ABNORMAL LOW (ref 70–139)
Glucose, Fasting: 62 mg/dL — ABNORMAL LOW (ref 70–99)

## 2013-03-08 LAB — HIV ANTIBODY (ROUTINE TESTING W REFLEX): HIV: NONREACTIVE

## 2013-03-08 LAB — RPR

## 2013-03-25 ENCOUNTER — Encounter: Payer: Self-pay | Admitting: Obstetrics & Gynecology

## 2013-03-28 ENCOUNTER — Ambulatory Visit (INDEPENDENT_AMBULATORY_CARE_PROVIDER_SITE_OTHER): Payer: Medicaid Other | Admitting: Obstetrics & Gynecology

## 2013-03-28 VITALS — BP 112/69 | Temp 96.8°F | Wt 228.0 lb

## 2013-03-28 DIAGNOSIS — Z3480 Encounter for supervision of other normal pregnancy, unspecified trimester: Secondary | ICD-10-CM

## 2013-03-28 LAB — POCT URINALYSIS DIPSTICK
BILIRUBIN UA: NEGATIVE
Blood, UA: NEGATIVE
GLUCOSE UA: NEGATIVE
Ketones, UA: NEGATIVE
Leukocytes, UA: NEGATIVE
NITRITE UA: NEGATIVE
Protein, UA: NEGATIVE
SPEC GRAV UA: 1.015
Urobilinogen, UA: NEGATIVE
pH, UA: 5

## 2013-03-28 NOTE — Progress Notes (Signed)
Pulse- 76 Patient states she is having pain and pressure in her lower abdomen

## 2013-03-29 LAB — URINE CULTURE
Colony Count: NO GROWTH
ORGANISM ID, BACTERIA: NO GROWTH

## 2013-04-18 ENCOUNTER — Encounter: Payer: Medicaid Other | Admitting: Obstetrics & Gynecology

## 2013-04-20 ENCOUNTER — Encounter: Payer: Medicaid Other | Admitting: Obstetrics & Gynecology

## 2013-04-21 ENCOUNTER — Ambulatory Visit (INDEPENDENT_AMBULATORY_CARE_PROVIDER_SITE_OTHER): Payer: Medicaid Other | Admitting: Obstetrics & Gynecology

## 2013-04-21 VITALS — BP 116/70 | Temp 97.7°F | Wt 232.0 lb

## 2013-04-21 DIAGNOSIS — Z3480 Encounter for supervision of other normal pregnancy, unspecified trimester: Secondary | ICD-10-CM

## 2013-04-21 LAB — POCT URINALYSIS DIPSTICK
Bilirubin, UA: NEGATIVE
Blood, UA: NEGATIVE
GLUCOSE UA: NEGATIVE
Ketones, UA: NEGATIVE
LEUKOCYTES UA: NEGATIVE
NITRITE UA: NEGATIVE
PH UA: 5
Protein, UA: NEGATIVE
Spec Grav, UA: 1.02
Urobilinogen, UA: NEGATIVE

## 2013-04-21 NOTE — Progress Notes (Signed)
Pulse- 97 Pt states she is having pelvis pain and hip pain.

## 2013-04-24 NOTE — Patient Instructions (Signed)
Third Trimester of Pregnancy  The third trimester is from week 29 through week 42, months 7 through 9. The third trimester is a time when the fetus is growing rapidly. At the end of the ninth month, the fetus is about 20 inches in length and weighs 6 10 pounds.   BODY CHANGES  Your body goes through many changes during pregnancy. The changes vary from woman to woman.    Your weight will continue to increase. You can expect to gain 25 35 pounds (11 16 kg) by the end of the pregnancy.   You may begin to get stretch marks on your hips, abdomen, and breasts.   You may urinate more often because the fetus is moving lower into your pelvis and pressing on your bladder.   You may develop or continue to have heartburn as a result of your pregnancy.   You may develop constipation because certain hormones are causing the muscles that push waste through your intestines to slow down.   You may develop hemorrhoids or swollen, bulging veins (varicose veins).   You may have pelvic pain because of the weight gain and pregnancy hormones relaxing your joints between the bones in your pelvis. Back aches may result from over exertion of the muscles supporting your posture.   Your breasts will continue to grow and be tender. A yellow discharge may leak from your breasts called colostrum.   Your belly button may stick out.   You may feel short of breath because of your expanding uterus.   You may notice the fetus "dropping," or moving lower in your abdomen.   You may have a bloody mucus discharge. This usually occurs a few days to a week before labor begins.   Your cervix becomes thin and soft (effaced) near your due date.  WHAT TO EXPECT AT YOUR PRENATAL EXAMS   You will have prenatal exams every 2 weeks until week 36. Then, you will have weekly prenatal exams. During a routine prenatal visit:   You will be weighed to make sure you and the fetus are growing normally.   Your blood pressure is taken.   Your abdomen will be  measured to track your baby's growth.   The fetal heartbeat will be listened to.   Any test results from the previous visit will be discussed.   You may have a cervical check near your due date to see if you have effaced.  At around 36 weeks, your caregiver will check your cervix. At the same time, your caregiver will also perform a test on the secretions of the vaginal tissue. This test is to determine if a type of bacteria, Group B streptococcus, is present. Your caregiver will explain this further.  Your caregiver may ask you:   What your birth plan is.   How you are feeling.   If you are feeling the baby move.   If you have had any abnormal symptoms, such as leaking fluid, bleeding, severe headaches, or abdominal cramping.   If you have any questions.  Other tests or screenings that may be performed during your third trimester include:   Blood tests that check for low iron levels (anemia).   Fetal testing to check the health, activity level, and growth of the fetus. Testing is done if you have certain medical conditions or if there are problems during the pregnancy.  FALSE LABOR  You may feel small, irregular contractions that eventually go away. These are called Braxton Hicks contractions, or   false labor. Contractions may last for hours, days, or even weeks before true labor sets in. If contractions come at regular intervals, intensify, or become painful, it is best to be seen by your caregiver.   SIGNS OF LABOR    Menstrual-like cramps.   Contractions that are 5 minutes apart or less.   Contractions that start on the top of the uterus and spread down to the lower abdomen and back.   A sense of increased pelvic pressure or back pain.   A watery or bloody mucus discharge that comes from the vagina.  If you have any of these signs before the 37th week of pregnancy, call your caregiver right away. You need to go to the hospital to get checked immediately.  HOME CARE INSTRUCTIONS    Avoid all  smoking, herbs, alcohol, and unprescribed drugs. These chemicals affect the formation and growth of the baby.   Follow your caregiver's instructions regarding medicine use. There are medicines that are either safe or unsafe to take during pregnancy.   Exercise only as directed by your caregiver. Experiencing uterine cramps is a good sign to stop exercising.   Continue to eat regular, healthy meals.   Wear a good support bra for breast tenderness.   Do not use hot tubs, steam rooms, or saunas.   Wear your seat belt at all times when driving.   Avoid raw meat, uncooked cheese, cat litter boxes, and soil used by cats. These carry germs that can cause birth defects in the baby.   Take your prenatal vitamins.   Try taking a stool softener (if your caregiver approves) if you develop constipation. Eat more high-fiber foods, such as fresh vegetables or fruit and whole grains. Drink plenty of fluids to keep your urine clear or pale yellow.   Take warm sitz baths to soothe any pain or discomfort caused by hemorrhoids. Use hemorrhoid cream if your caregiver approves.   If you develop varicose veins, wear support hose. Elevate your feet for 15 minutes, 3 4 times a day. Limit salt in your diet.   Avoid heavy lifting, wear low heal shoes, and practice good posture.   Rest a lot with your legs elevated if you have leg cramps or low back pain.   Visit your dentist if you have not gone during your pregnancy. Use a soft toothbrush to brush your teeth and be gentle when you floss.   A sexual relationship may be continued unless your caregiver directs you otherwise.   Do not travel far distances unless it is absolutely necessary and only with the approval of your caregiver.   Take prenatal classes to understand, practice, and ask questions about the labor and delivery.   Make a trial run to the hospital.   Pack your hospital bag.   Prepare the baby's nursery.   Continue to go to all your prenatal visits as directed  by your caregiver.  SEEK MEDICAL CARE IF:   You are unsure if you are in labor or if your water has broken.   You have dizziness.   You have mild pelvic cramps, pelvic pressure, or nagging pain in your abdominal area.   You have persistent nausea, vomiting, or diarrhea.   You have a bad smelling vaginal discharge.   You have pain with urination.  SEEK IMMEDIATE MEDICAL CARE IF:    You have a fever.   You are leaking fluid from your vagina.   You have spotting or bleeding from your vagina.     You have severe abdominal cramping or pain.   You have rapid weight loss or gain.   You have shortness of breath with chest pain.   You notice sudden or extreme swelling of your face, hands, ankles, feet, or legs.   You have not felt your baby move in over an hour.   You have severe headaches that do not go away with medicine.   You have vision changes.  Document Released: 01/07/2001 Document Revised: 09/15/2012 Document Reviewed: 03/16/2012  [employer] Patient Information 2014 [employer].

## 2013-04-27 ENCOUNTER — Encounter: Payer: Self-pay | Admitting: Obstetrics & Gynecology

## 2013-04-27 ENCOUNTER — Other Ambulatory Visit: Payer: Self-pay | Admitting: Obstetrics & Gynecology

## 2013-04-27 ENCOUNTER — Ambulatory Visit (INDEPENDENT_AMBULATORY_CARE_PROVIDER_SITE_OTHER): Payer: Medicaid Other

## 2013-04-27 DIAGNOSIS — O365990 Maternal care for other known or suspected poor fetal growth, unspecified trimester, not applicable or unspecified: Secondary | ICD-10-CM

## 2013-04-27 DIAGNOSIS — O3660X0 Maternal care for excessive fetal growth, unspecified trimester, not applicable or unspecified: Secondary | ICD-10-CM

## 2013-04-27 LAB — US OB DETAIL + 14 WK

## 2013-05-09 ENCOUNTER — Encounter: Payer: Medicaid Other | Admitting: Obstetrics & Gynecology

## 2013-05-26 ENCOUNTER — Encounter: Payer: Self-pay | Admitting: Obstetrics

## 2013-05-26 ENCOUNTER — Ambulatory Visit (INDEPENDENT_AMBULATORY_CARE_PROVIDER_SITE_OTHER): Payer: Medicaid Other | Admitting: Obstetrics

## 2013-05-26 VITALS — BP 122/76 | HR 76 | Temp 98.1°F | Wt 240.0 lb

## 2013-05-26 DIAGNOSIS — K219 Gastro-esophageal reflux disease without esophagitis: Secondary | ICD-10-CM

## 2013-05-26 DIAGNOSIS — Z3480 Encounter for supervision of other normal pregnancy, unspecified trimester: Secondary | ICD-10-CM

## 2013-05-26 LAB — POCT URINALYSIS DIPSTICK
BILIRUBIN UA: NEGATIVE
Blood, UA: NEGATIVE
GLUCOSE UA: NEGATIVE
KETONES UA: NEGATIVE
LEUKOCYTES UA: NEGATIVE
Nitrite, UA: NEGATIVE
PH UA: 7
Protein, UA: NEGATIVE
Spec Grav, UA: 1.015
Urobilinogen, UA: NEGATIVE

## 2013-05-26 MED ORDER — OMEPRAZOLE 20 MG PO CPDR: 20.0000 mg | DELAYED_RELEASE_CAPSULE | Freq: Two times a day (BID) | ORAL | Status: DC

## 2013-05-26 NOTE — Progress Notes (Signed)
Subjective:    Emma Gardner is a 28 y.o. female being seen today for her obstetrical visit. She is at 6828w1d gestation. Patient reports no complaints. Fetal movement: normal.  Problem List Items Addressed This Visit   Supervision of other normal pregnancy - Primary   Relevant Orders      POCT urinalysis dipstick (Completed)    Other Visit Diagnoses   GERD without esophagitis        Relevant Medications       omeprazole (PRILOSEC) capsule      Patient Active Problem List   Diagnosis Date Noted  . Supervision of other normal pregnancy 01/17/2013  . Pelvic pain 05/20/2012  . Anxiety state, unspecified   . Nausea alone 03/17/2012  . Situational stress 03/17/2012  . HEADACHE 01/15/2010  . ABDOMINAL PAIN -GENERALIZED 12/19/2008  . DIARRHEA 03/13/2008    Objective:    BP 122/76  Pulse 76  Temp(Src) 98.1 F (36.7 C)  Wt 240 lb (108.863 kg)  LMP 09/01/2012 FHT: 140 BPM  Uterine Size: size equals dates  Presentations: unsure  Pelvic Exam: Deferred    Assessment:    Pregnancy @ 1228w1d weeks   Plan:   Plans for delivery: Vaginal anticipated; labs reviewed; problem list updated Counseling: Consent signed. Infant feeding: plans to breastfeed. Cigarette smoking: never smoked. L&D discussion: symptoms of labor, discussed when to call, discussed what number to call, anesthetic/analgesic options reviewed and delivering clinician:  plans Physician. Postpartum supports and preparation: circumcision discussed and contraception plans discussed.  Follow up in 1 Week.

## 2013-06-02 ENCOUNTER — Encounter: Payer: Self-pay | Admitting: Obstetrics & Gynecology

## 2013-06-02 ENCOUNTER — Ambulatory Visit (INDEPENDENT_AMBULATORY_CARE_PROVIDER_SITE_OTHER): Payer: Medicaid Other | Admitting: Obstetrics & Gynecology

## 2013-06-02 VITALS — BP 119/76 | HR 92 | Temp 98.2°F | Wt 244.0 lb

## 2013-06-02 DIAGNOSIS — Z3480 Encounter for supervision of other normal pregnancy, unspecified trimester: Secondary | ICD-10-CM

## 2013-06-02 LAB — POCT URINALYSIS DIPSTICK
Blood, UA: NEGATIVE
GLUCOSE UA: 50
NITRITE UA: NEGATIVE
Protein, UA: NEGATIVE
Spec Grav, UA: 1.02
Urobilinogen, UA: 1
pH, UA: 5

## 2013-06-02 NOTE — Progress Notes (Signed)
Subjective:    Emma HallerOmniya S Hankerson is a 28 y.o. female being seen today for her obstetrical visit. She is at 4170w1d  gestation. Patient reports no complaints. Fetal movement: normal.  Problem List Items Addressed This Visit   Supervision of other normal pregnancy - Primary   Relevant Orders      POCT urinalysis dipstick (Completed)     Patient Active Problem List   Diagnosis Date Noted  . Supervision of other normal pregnancy 01/17/2013  . Pelvic pain 05/20/2012  . Anxiety state, unspecified   . Nausea alone 03/17/2012  . Situational stress 03/17/2012  . HEADACHE 01/15/2010  . ABDOMINAL PAIN -GENERALIZED 12/19/2008  . DIARRHEA 03/13/2008    Objective:    BP 119/76  Pulse 92  Temp(Src) 98.2 F (36.8 C)  Wt 110.678 kg (244 lb)  LMP 09/01/2012 FHT: 140 BPM  Uterine Size: size equals dates   Vtx, EFW 3000 gm by Leopold[facility]  Pelvic Exam: Deferred    Assessment:    Pregnancy @ 5670w1d  weeks   Plan:    Follow up in 1 Week.

## 2013-06-04 LAB — STREP B DNA PROBE: GBSP: NEGATIVE

## 2013-06-08 ENCOUNTER — Encounter: Payer: Self-pay | Admitting: Obstetrics & Gynecology

## 2013-06-08 ENCOUNTER — Ambulatory Visit (INDEPENDENT_AMBULATORY_CARE_PROVIDER_SITE_OTHER): Payer: Medicaid Other | Admitting: Obstetrics & Gynecology

## 2013-06-08 VITALS — BP 119/74 | HR 87 | Temp 98.3°F | Wt 242.0 lb

## 2013-06-08 DIAGNOSIS — Z3480 Encounter for supervision of other normal pregnancy, unspecified trimester: Secondary | ICD-10-CM

## 2013-06-08 LAB — POCT URINALYSIS DIPSTICK
Blood, UA: NEGATIVE
Glucose, UA: NEGATIVE
Ketones, UA: NEGATIVE
Nitrite, UA: NEGATIVE
PH UA: 6.5
PROTEIN UA: NEGATIVE
Spec Grav, UA: 1.015

## 2013-06-12 NOTE — Progress Notes (Signed)
Subjective:    Emma HallerOmniya S Gardner is a 28 y.o. female being seen today for her obstetrical visit. She is at 2565w1d  gestation. Patient reports no complaints. Fetal movement: normal.  Problem List Items Addressed This Visit   Supervision of other normal pregnancy - Primary   Relevant Orders      POCT urinalysis dipstick (Completed)     Patient Active Problem List   Diagnosis Date Noted  . Supervision of other normal pregnancy 01/17/2013  . Pelvic pain 05/20/2012  . Anxiety state, unspecified   . Nausea alone 03/17/2012  . Situational stress 03/17/2012  . HEADACHE 01/15/2010  . ABDOMINAL PAIN -GENERALIZED 12/19/2008  . DIARRHEA 03/13/2008    Objective:    BP 119/74  Pulse 87  Temp(Src) 98.3 F (36.8 C)  Wt 109.77 kg (242 lb)  LMP 09/01/2012 FHT: 140 BPM  Uterine Size: size equals dates     Pelvic Exam: Deferred    Assessment:    Pregnancy @ 3265w1d  weeks  [facility]  Plan:    Follow up in 1 Week.

## 2013-06-16 ENCOUNTER — Ambulatory Visit (INDEPENDENT_AMBULATORY_CARE_PROVIDER_SITE_OTHER): Payer: Medicaid Other | Admitting: Obstetrics & Gynecology

## 2013-06-16 ENCOUNTER — Encounter: Payer: Self-pay | Admitting: Obstetrics & Gynecology

## 2013-06-16 VITALS — BP 134/82 | HR 92 | Temp 97.5°F | Wt 244.0 lb

## 2013-06-16 DIAGNOSIS — Z3480 Encounter for supervision of other normal pregnancy, unspecified trimester: Secondary | ICD-10-CM

## 2013-06-16 LAB — POCT URINALYSIS DIPSTICK
Bilirubin, UA: NEGATIVE
Glucose, UA: 50
Ketones, UA: NEGATIVE
LEUKOCYTES UA: NEGATIVE
NITRITE UA: NEGATIVE
PH UA: 6
PROTEIN UA: NEGATIVE
RBC UA: NEGATIVE
Spec Grav, UA: 1.02
Urobilinogen, UA: NEGATIVE

## 2013-06-16 NOTE — Progress Notes (Signed)
Subjective:    Emma HallerOmniya S Gardner is a 28 y.o. female being seen today for her obstetrical visit. She is at 5266w2d  gestation. Patient reports backache and fatigue. Fetal movement: normal.  Problem List Items Addressed This Visit   Supervision of other normal pregnancy - Primary   Relevant Orders      POCT urinalysis dipstick (Completed)     Patient Active Problem List   Diagnosis Date Noted  . Supervision of other normal pregnancy 01/17/2013  . Pelvic pain 05/20/2012  . Anxiety state, unspecified   . Nausea alone 03/17/2012  . Situational stress 03/17/2012  . HEADACHE 01/15/2010  . ABDOMINAL PAIN -GENERALIZED 12/19/2008  . DIARRHEA 03/13/2008    Objective:    BP 134/82  Pulse 92  Temp(Src) 97.5 F (36.4 C)  Wt 110.678 kg (244 lb)  LMP 09/01/2012 FHT: 140 BPM  Uterine Size: size equals dates     Pelvic Exam: Deferred    Assessment:    Pregnancy @ 4266w2d   weeks  [facility]  Plan:    IOL next week

## 2013-06-16 NOTE — Patient Instructions (Signed)
Labor Induction  Labor induction is when steps are taken to cause a pregnant woman to begin the labor process. Most women go into labor on their own between 37 weeks and 42 weeks of the pregnancy. When this does not happen or when there is a medical need, methods may be used to induce labor. Labor induction causes a pregnant woman's uterus to contract. It also causes the cervix to soften (ripen), open (dilate), and thin out (efface). Usually, labor is not induced before 39 weeks of the pregnancy unless there is a problem with the baby or mother.  Before inducing labor, your health care provider will consider a number of factors, including the following:  The medical condition of you and the baby.   How many weeks along you are.   The status of the baby's lung maturity.   The condition of the cervix.   The position of the baby.  WHAT ARE THE REASONS FOR LABOR INDUCTION? Labor may be induced for the following reasons:  The health of the baby or mother is at risk.   The pregnancy is overdue by 1 week or more.   The water breaks but labor does not start on its own.   The mother has a health condition or serious illness, such as high blood pressure, infection, placental abruption, or diabetes.  The amniotic fluid amounts are low around the baby.   The baby is distressed.  Convenience or wanting the baby to be born on a certain date is not a reason for inducing labor. WHAT METHODS ARE USED FOR LABOR INDUCTION? Several methods of labor induction may be used, such as:   Prostaglandin medicine. This medicine causes the cervix to dilate and ripen. The medicine will also start contractions. It can be taken by mouth or by inserting a suppository into the vagina.   Inserting a thin tube (catheter) with a balloon on the end into the vagina to dilate the cervix. Once inserted, the balloon is expanded with water, which causes the cervix to open.   Stripping the membranes. Your health  care provider separates amniotic sac tissue from the cervix, causing the cervix to be stretched and causing the release of a hormone called progesterone. This may cause the uterus to contract. It is often done during an office visit. You will be sent home to wait for the contractions to begin. You will then come in for an induction.   Breaking the water. Your health care provider makes a hole in the amniotic sac using a small instrument. Once the amniotic sac breaks, contractions should begin. This may still take hours to see an effect.   Medicine to trigger or strengthen contractions. This medicine is given through an IV access tube inserted into a vein in your arm.  All of the methods of induction, besides stripping the membranes, will be done in the hospital. Induction is done in the hospital so that you and the baby can be carefully monitored.  HOW LONG DOES IT TAKE FOR LABOR TO BE INDUCED? Some inductions can take up to 2 3 days. Depending on the cervix, it usually takes less time. It takes longer when you are induced early in the pregnancy or if this is your first pregnancy. If a mother is still pregnant and the induction has been going on for 2 3 days, either the mother will be sent home or a cesarean delivery will be needed. WHAT ARE THE RISKS ASSOCIATED WITH LABOR INDUCTION? Some of the risks   of induction include:   Changes in fetal heart rate, such as too high, too low, or erratic.   Fetal distress.   Chance of infection for the mother and baby.   Increased chance of having a cesarean delivery.   Breaking off (abruption) of the placenta from the uterus (rare).   Uterine rupture (very rare).  When induction is needed for medical reasons, the benefits of induction may outweigh the risks. WHAT ARE SOME REASONS FOR NOT INDUCING LABOR? Labor induction should not be done if:   It is shown that your baby does not tolerate labor.   You have had previous surgeries on your  uterus, such as a myomectomy or the removal of fibroids.   Your placenta lies very low in the uterus and blocks the opening of the cervix (placenta previa).   Your baby is not in a head-down position.   The umbilical cord drops down into the birth canal in front of the baby. This could cut off the baby's blood and oxygen supply.   You have had a previous cesarean delivery.   There are unusual circumstances, such as the baby being extremely premature.  Document Released: 06/04/2006 Document Revised: 09/15/2012 Document Reviewed: 08/12/2012 [employer] Patient Information 2014 [employer].  

## 2013-06-17 ENCOUNTER — Telehealth (HOSPITAL_COMMUNITY): Payer: Self-pay | Admitting: *Deleted

## 2013-06-17 ENCOUNTER — Encounter (HOSPITAL_COMMUNITY): Payer: Self-pay | Admitting: *Deleted

## 2013-06-17 NOTE — Telephone Encounter (Signed)
Preadmission screen  

## 2013-06-20 ENCOUNTER — Inpatient Hospital Stay (HOSPITAL_COMMUNITY): Admission: AD | Admit: 2013-06-20 | Payer: Medicaid Other | Source: Ambulatory Visit | Admitting: Obstetrics

## 2013-06-21 ENCOUNTER — Other Ambulatory Visit: Payer: Self-pay | Admitting: *Deleted

## 2013-06-22 ENCOUNTER — Inpatient Hospital Stay (HOSPITAL_COMMUNITY): Payer: Medicaid Other

## 2013-06-22 ENCOUNTER — Inpatient Hospital Stay (HOSPITAL_COMMUNITY): Payer: Medicaid Other | Admitting: Anesthesiology

## 2013-06-22 ENCOUNTER — Encounter (HOSPITAL_COMMUNITY): Payer: Medicaid Other | Admitting: Anesthesiology

## 2013-06-22 ENCOUNTER — Encounter (HOSPITAL_COMMUNITY): Payer: Self-pay

## 2013-06-22 ENCOUNTER — Inpatient Hospital Stay (HOSPITAL_COMMUNITY)
Admission: RE | Admit: 2013-06-22 | Discharge: 2013-06-24 | DRG: 775 | Disposition: A | Discharge status: Home / Self Care | Payer: Medicaid Other | Source: Ambulatory Visit | Attending: Obstetrics & Gynecology | Admitting: Obstetrics & Gynecology

## 2013-06-22 DIAGNOSIS — O99214 Obesity complicating childbirth: Secondary | ICD-10-CM

## 2013-06-22 DIAGNOSIS — O480 Post-term pregnancy: Secondary | ICD-10-CM | POA: Diagnosis present

## 2013-06-22 DIAGNOSIS — Z6839 Body mass index (BMI) 39.0-39.9, adult: Secondary | ICD-10-CM

## 2013-06-22 DIAGNOSIS — Z87891 Personal history of nicotine dependence: Secondary | ICD-10-CM

## 2013-06-22 DIAGNOSIS — E669 Obesity, unspecified: Secondary | ICD-10-CM | POA: Diagnosis present

## 2013-06-22 LAB — CBC
HCT: 32.5 % — ABNORMAL LOW (ref 36.0–46.0)
Hemoglobin: 10.9 g/dL — ABNORMAL LOW (ref 12.0–15.0)
MCH: 31.1 pg (ref 26.0–34.0)
MCHC: 33.5 g/dL (ref 30.0–36.0)
MCV: 92.6 fL (ref 78.0–100.0)
PLATELETS: 316 10*3/uL (ref 150–400)
RBC: 3.51 MIL/uL — ABNORMAL LOW (ref 3.87–5.11)
RDW: 13.7 % (ref 11.5–15.5)
WBC: 13 10*3/uL — ABNORMAL HIGH (ref 4.0–10.5)

## 2013-06-22 LAB — RPR

## 2013-06-22 MED ORDER — MEASLES, MUMPS & RUBELLA VAC ~~LOC~~ INJ
0.5000 mL | INJECTION | Freq: Once | SUBCUTANEOUS | Status: AC
Start: 2013-06-23 — End: 2013-06-24
  Administered 2013-06-24: 0.5 mL via SUBCUTANEOUS
  Filled 2013-06-22 (×2): qty 0.5

## 2013-06-22 MED ORDER — ONDANSETRON HCL 4 MG/2ML IJ SOLN: 4.0000 mg | Freq: Four times a day (QID) | INTRAMUSCULAR | Status: DC | PRN

## 2013-06-22 MED ORDER — BENZOCAINE-MENTHOL 20-0.5 % EX AERO: 1.0000 "application " | INHALATION_SPRAY | CUTANEOUS | Status: DC | PRN

## 2013-06-22 MED ORDER — FERROUS SULFATE 325 (65 FE) MG PO TABS
325.0000 mg | ORAL_TABLET | Freq: Two times a day (BID) | ORAL | Status: DC
  Administered 2013-06-23 – 2013-06-24 (×3): 325 mg via ORAL
  Filled 2013-06-22 (×3): qty 1

## 2013-06-22 MED ORDER — HYDROXYZINE HCL 50 MG PO TABS
50.0000 mg | ORAL_TABLET | Freq: Four times a day (QID) | ORAL | Status: DC | PRN
  Filled 2013-06-22: qty 1

## 2013-06-22 MED ORDER — PRENATAL MULTIVITAMIN CH
1.0000 | ORAL_TABLET | Freq: Every day | ORAL | Status: DC
  Administered 2013-06-23: 1 via ORAL
  Filled 2013-06-22: qty 1

## 2013-06-22 MED ORDER — DIPHENHYDRAMINE HCL 25 MG PO CAPS: 25.0000 mg | ORAL_CAPSULE | Freq: Four times a day (QID) | ORAL | Status: DC | PRN

## 2013-06-22 MED ORDER — ONDANSETRON HCL 4 MG/2ML IJ SOLN: 4.0000 mg | INTRAMUSCULAR | Status: DC | PRN

## 2013-06-22 MED ORDER — CITRIC ACID-SODIUM CITRATE 334-500 MG/5ML PO SOLN: 30.0000 mL | ORAL | Status: DC | PRN

## 2013-06-22 MED ORDER — OXYTOCIN 40 UNITS IN LACTATED RINGERS INFUSION - SIMPLE MED: 62.5000 mL/h | INTRAVENOUS | Status: DC

## 2013-06-22 MED ORDER — LACTATED RINGERS IV SOLN: INTRAVENOUS | Status: DC

## 2013-06-22 MED ORDER — OXYTOCIN 40 UNITS IN LACTATED RINGERS INFUSION - SIMPLE MED
1.0000 m[IU]/min | INTRAVENOUS | Status: DC
  Administered 2013-06-22: 4 m[IU]/min via INTRAVENOUS
  Administered 2013-06-22: 6 m[IU]/min via INTRAVENOUS
  Administered 2013-06-22: 2 m[IU]/min via INTRAVENOUS
  Administered 2013-06-22: 4 m[IU]/min via INTRAVENOUS
  Administered 2013-06-22: 8 m[IU]/min via INTRAVENOUS
  Administered 2013-06-22: 2 m[IU]/min via INTRAVENOUS
  Filled 2013-06-22: qty 1000

## 2013-06-22 MED ORDER — EPHEDRINE 5 MG/ML INJ
INTRAVENOUS | Status: AC
  Filled 2013-06-22: qty 4

## 2013-06-22 MED ORDER — LIDOCAINE HCL (PF) 1 % IJ SOLN
INTRAMUSCULAR | Status: DC | PRN
  Administered 2013-06-22 (×2): 5 mL

## 2013-06-22 MED ORDER — EPHEDRINE 5 MG/ML INJ
10.0000 mg | INTRAVENOUS | Status: DC | PRN
  Filled 2013-06-22: qty 2

## 2013-06-22 MED ORDER — LACTATED RINGERS IV SOLN
500.0000 mL | Freq: Once | INTRAVENOUS | Status: AC
  Administered 2013-06-22: 500 mL via INTRAVENOUS

## 2013-06-22 MED ORDER — TERBUTALINE SULFATE 1 MG/ML IJ SOLN: 0.2500 mg | Freq: Once | INTRAMUSCULAR | Status: DC | PRN

## 2013-06-22 MED ORDER — LACTATED RINGERS IV SOLN: 500.0000 mL | INTRAVENOUS | Status: DC | PRN

## 2013-06-22 MED ORDER — OXYTOCIN BOLUS FROM INFUSION: 500.0000 mL | INTRAVENOUS | Status: DC

## 2013-06-22 MED ORDER — TETANUS-DIPHTH-ACELL PERTUSSIS 5-2.5-18.5 LF-MCG/0.5 IM SUSP: 0.5000 mL | Freq: Once | INTRAMUSCULAR | Status: DC

## 2013-06-22 MED ORDER — PHENYLEPHRINE 40 MCG/ML (10ML) SYRINGE FOR IV PUSH (FOR BLOOD PRESSURE SUPPORT)
80.0000 ug | PREFILLED_SYRINGE | INTRAVENOUS | Status: DC | PRN
  Filled 2013-06-22: qty 10
  Filled 2013-06-22: qty 2

## 2013-06-22 MED ORDER — LACTATED RINGERS IV SOLN
INTRAVENOUS | Status: DC
  Administered 2013-06-22: 125 mL via INTRAVENOUS

## 2013-06-22 MED ORDER — DIPHENHYDRAMINE HCL 50 MG/ML IJ SOLN: 12.5000 mg | INTRAMUSCULAR | Status: DC | PRN

## 2013-06-22 MED ORDER — DIBUCAINE 1 % RE OINT: 1.0000 "application " | TOPICAL_OINTMENT | RECTAL | Status: DC | PRN

## 2013-06-22 MED ORDER — ONDANSETRON HCL 4 MG PO TABS: 4.0000 mg | ORAL_TABLET | ORAL | Status: DC | PRN

## 2013-06-22 MED ORDER — WITCH HAZEL-GLYCERIN EX PADS: 1.0000 "application " | MEDICATED_PAD | CUTANEOUS | Status: DC | PRN

## 2013-06-22 MED ORDER — OXYTOCIN 40 UNITS IN LACTATED RINGERS INFUSION - SIMPLE MED: 1.0000 m[IU]/min | INTRAVENOUS | Status: DC

## 2013-06-22 MED ORDER — IBUPROFEN 600 MG PO TABS
600.0000 mg | ORAL_TABLET | Freq: Four times a day (QID) | ORAL | Status: DC
  Administered 2013-06-23 – 2013-06-24 (×5): 600 mg via ORAL
  Filled 2013-06-22 (×5): qty 1

## 2013-06-22 MED ORDER — SENNOSIDES-DOCUSATE SODIUM 8.6-50 MG PO TABS
2.0000 | ORAL_TABLET | ORAL | Status: DC
  Administered 2013-06-23 (×2): 2 via ORAL
  Filled 2013-06-22: qty 12
  Filled 2013-06-22: qty 2

## 2013-06-22 MED ORDER — FENTANYL 2.5 MCG/ML BUPIVACAINE 1/10 % EPIDURAL INFUSION (WH - ANES): 14.0000 mL/h | INTRAMUSCULAR | Status: DC | PRN

## 2013-06-22 MED ORDER — ZOLPIDEM TARTRATE 5 MG PO TABS: 5.0000 mg | ORAL_TABLET | Freq: Every evening | ORAL | Status: DC | PRN

## 2013-06-22 MED ORDER — FENTANYL 2.5 MCG/ML BUPIVACAINE 1/10 % EPIDURAL INFUSION (WH - ANES)
INTRAMUSCULAR | Status: AC
  Administered 2013-06-22: 14 mL/h via EPIDURAL
  Filled 2013-06-22: qty 125

## 2013-06-22 MED ORDER — TRAMADOL HCL 50 MG PO TABS
100.0000 mg | ORAL_TABLET | Freq: Four times a day (QID) | ORAL | Status: DC
Start: 2013-06-23 — End: 2013-06-24
  Administered 2013-06-23 (×3): 100 mg via ORAL
  Filled 2013-06-22 (×5): qty 2

## 2013-06-22 MED ORDER — PHENYLEPHRINE 40 MCG/ML (10ML) SYRINGE FOR IV PUSH (FOR BLOOD PRESSURE SUPPORT)
PREFILLED_SYRINGE | INTRAVENOUS | Status: AC
  Administered 2013-06-22: 40 ug
  Filled 2013-06-22: qty 10

## 2013-06-22 MED ORDER — PHENYLEPHRINE 40 MCG/ML (10ML) SYRINGE FOR IV PUSH (FOR BLOOD PRESSURE SUPPORT)
80.0000 ug | PREFILLED_SYRINGE | INTRAVENOUS | Status: DC | PRN
  Administered 2013-06-22 (×2): 80 ug via INTRAVENOUS
  Filled 2013-06-22: qty 2

## 2013-06-22 MED ORDER — MAGNESIUM HYDROXIDE 400 MG/5ML PO SUSP: 30.0000 mL | ORAL | Status: DC | PRN

## 2013-06-22 MED ORDER — LIDOCAINE HCL (PF) 1 % IJ SOLN
30.0000 mL | INTRAMUSCULAR | Status: DC | PRN
  Filled 2013-06-22: qty 30

## 2013-06-22 MED ORDER — IBUPROFEN 600 MG PO TABS
600.0000 mg | ORAL_TABLET | Freq: Four times a day (QID) | ORAL | Status: DC | PRN
  Administered 2013-06-22: 600 mg via ORAL
  Filled 2013-06-22: qty 1

## 2013-06-22 MED ORDER — LANOLIN HYDROUS EX OINT: TOPICAL_OINTMENT | CUTANEOUS | Status: DC | PRN

## 2013-06-22 NOTE — Anesthesia Procedure Notes (Signed)
Epidural Patient location during procedure: OB Start time: 06/22/2013 4:16 PM  Staffing Anesthesiologist: Brayton Caves Performed by: anesthesiologist   Preanesthetic Checklist Completed: patient identified, site marked, surgical consent, pre-op evaluation, timeout performed, IV checked, risks and benefits discussed and monitors and equipment checked  Epidural Patient position: sitting Prep: site prepped and draped and DuraPrep Patient monitoring: continuous pulse ox and blood pressure Approach: midline Location: L3-L4 Injection technique: LOR air  Needle:  Needle type: Tuohy  Needle gauge: 17 G Needle length: 9 cm and 9 Needle insertion depth: 6 cm Catheter type: closed end flexible Catheter size: 19 Gauge Catheter at skin depth: 11 cm Test dose: negative  Assessment Events: blood not aspirated, injection not painful, no injection resistance, negative IV test and no paresthesia  Additional Notes Patient identified.  Risk benefits discussed including failed block, incomplete pain control, headache, nerve damage, paralysis, blood pressure changes, nausea, vomiting, reactions to medication both toxic or allergic, and postpartum back pain.  Patient expressed understanding and wished to proceed.  All questions were answered.  Sterile technique used throughout procedure and epidural site dressed with sterile barrier dressing. No paresthesia or other complications noted.The patient did not experience any signs of intravascular injection such as tinnitus or metallic taste in mouth nor signs of intrathecal spread such as rapid motor block. Please see nursing notes for vital signs.

## 2013-06-22 NOTE — H&P (Signed)
Emma Gardner is a 28 y.o. female presenting for IOL. Maternal Medical History:  Reason for admission: She presents for an elective IOL.  Fetal activity: Perceived fetal activity is normal.    Prenatal complications: no prenatal complications Prenatal Complications - Diabetes: none.    OB History   Grav Para Term Preterm Abortions TAB SAB Ect Mult Living   3 2 2       2      Past Medical History  Diagnosis Date  . Ovarian cyst   . Abnormal Pap smear   . Anxiety   . H. pylori infection 2008    ?reinfx - re-tx 03/2012 triple pk  . SVD (spontaneous vaginal delivery)     x 2  . Headache(784.0)     otc med prn  . Ovarian cyst    Past Surgical History  Procedure Laterality Date  . Endometrial biopsy      normal  . Cervical biopsy    . Essure tubal ligation  07/2011  . Tubal ligation    . Laparoscopy N/A 06/04/2012    Procedure: LAPAROSCOPY DIAGNOSTIC;  Surgeon: Antionette Char, MD;  Location: [facility];  Service: Gynecology;  Laterality: N/A;   Family History: family history includes Dementia in her paternal grandfather and paternal grandmother; Depression in her maternal grandmother and mother. Social History:  reports that she quit smoking about 2 years ago. Her smoking use included Cigars. She has never used smokeless tobacco. She reports that she does not drink alcohol or use illicit drugs.    Review of Systems  Constitutional: Negative for fever.  Eyes: Negative for blurred vision.  Respiratory: Negative for shortness of breath.   Gastrointestinal: Negative for vomiting.  Skin: Negative for rash.  Neurological: Negative for headaches.    Dilation: 3 Effacement (%): 50 Station: -1;-2 Exam by:: dr Tamela Oddi Blood pressure 105/51, pulse 64, temperature 98.7 F (37.1 C), temperature source Oral, resp. rate 20, height 5\' 6"  (1.676 m), weight 110.678 kg (244 lb), last menstrual period 09/01/2012.  AROM: clear fluid; FSE/IUPC placed Maternal Exam:  Uterine  Assessment: Contraction frequency is irregular.   Abdomen: Patient reports no abdominal tenderness. Fetal presentation: vertex  Introitus: Normal vulva. Amniotic fluid character: clear.  Pelvis: adequate for delivery.   Cervix: Cervix evaluated by digital exam.     Fetal Exam Fetal Monitor Review: Baseline rate: 140.  Variability: moderate (6-25 bpm).   Pattern: accelerations present and variable decelerations.    Fetal State Assessment: Category II - tracings are indeterminate.     Physical Exam  Constitutional: She appears well-developed.  HENT:  Head: Normocephalic.  Neck: Neck supple. No thyromegaly present.  Cardiovascular: Normal rate and regular rhythm.   Respiratory: Breath sounds normal.  GI: Soft. Bowel sounds are normal.  Skin: No rash noted.    Prenatal labs: ABO, Rh: A/POS/-- (12/22 1515) Antibody: NEG (12/22 1515) Rubella: 0.87 (12/22 1515) RPR: NON REAC (02/09 1154)  HBsAg: NEGATIVE (12/22 1515)  HIV: NON REACTIVE (02/09 1154)  GBS: NEGATIVE (05/07 1453)   Assessment/Plan: Multipara @ [redacted]w[redacted]d.  Admitted for an elective IOL, now with a Category II FHT on Pitocin.  The LTV/accelerations are c/w FWB.  Admit Amnioninfusion Resume Pitocin after the amnioinfusion bolus Monitor closely Anticipate an NSVD   Antionette Char 06/22/2013, 10:41 AM

## 2013-06-22 NOTE — Progress Notes (Signed)
KAYANNA RIDINGER is a 28 y.o. G3P2002 at [redacted]w[redacted]d by LMP admitted for induction of labor due to Elective at term.  Subjective: Comfortable  Objective: BP 109/59  Pulse 80  Temp(Src) 97.7 F (36.5 C) (Axillary)  Resp 20  Ht 5\' 6"  (1.676 m)  Wt 110.678 kg (244 lb)  BMI 39.40 kg/m2  SpO2 99%  LMP 09/01/2012      FHT:  FHR: 130 bpm, variability: moderate,  accelerations:  Present,  decelerations:  Present late UC:   regular, every 5 minutes SVE:   Dilation: 4 Effacement (%): 80 Station: -2 Exam by:: dherr rn  Labs: Lab Results  Component Value Date   WBC 13.0* 06/22/2013   HGB 10.9* 06/22/2013   HCT 32.5* 06/22/2013   MCV 92.6 06/22/2013   PLT 316 06/22/2013    Assessment / Plan: Latent labor  Labor: see above Preeclampsia:  n/a Fetal Wellbeing:  Category II; accelerations reassuring Pain Control:  Epidural I/D:  n/a Anticipated MOD:  NSVD  Antionette Char 06/22/2013, 6:34 PM

## 2013-06-22 NOTE — Progress Notes (Signed)
Per dr Tamela Oddi to restart to pitocin after amnioinfusion is complete. To run amnioinfusion for 3 hours.

## 2013-06-22 NOTE — Anesthesia Preprocedure Evaluation (Signed)
Anesthesia Evaluation  Patient identified by MRN, date of birth, ID band Patient awake    Reviewed: Allergy & Precautions, H&P , Patient's Chart, lab work & pertinent test results  Airway Mallampati: III TM Distance: >3 FB Neck ROM: full    Dental   Pulmonary former smoker,  breath sounds clear to auscultation        Cardiovascular Rhythm:regular Rate:Normal     Neuro/Psych  Headaches,    GI/Hepatic   Endo/Other  Morbid obesity  Renal/GU      Musculoskeletal   Abdominal   Peds  Hematology   Anesthesia Other Findings   Reproductive/Obstetrics (+) Pregnancy                           Anesthesia Physical Anesthesia Plan  ASA: III  Anesthesia Plan: Epidural   Post-op Pain Management:    Induction:   Airway Management Planned:   Additional Equipment:   Intra-op Plan:   Post-operative Plan:   Informed Consent: I have reviewed the patients History and Physical, chart, labs and discussed the procedure including the risks, benefits and alternatives for the proposed anesthesia with the patient or authorized representative who has indicated his/her understanding and acceptance.     Plan Discussed with:   Anesthesia Plan Comments:         Anesthesia Quick Evaluation

## 2013-06-23 ENCOUNTER — Encounter (HOSPITAL_COMMUNITY): Payer: Self-pay

## 2013-06-23 NOTE — Progress Notes (Signed)
UR chart review completed.  

## 2013-06-23 NOTE — Progress Notes (Signed)
Patient was referred for history of depression/anxiety. * Referral screened out by Clinical Social Worker because none of the following criteria appear to apply:  ~ History of anxiety/depression during this pregnancy, or of post-partum depression.  ~ Diagnosis of anxiety and/or depression within last 3 years  ~ History of depression due to pregnancy loss/loss of child  OR * Patient's symptoms currently being treated with medication and/or therapy.  Please contact the Clinical Social Worker if needs arise, or by the patient's request. Pts anxiety symptoms were related to health care issues after learning she had an ovarian cyst, 1 1/2 years ago.  Pt denies any anxiety since then & seems to appropriate this time.

## 2013-06-23 NOTE — Progress Notes (Signed)
Post Partum Day 1 Subjective: no complaints  Objective: Blood pressure 106/69, pulse 73, temperature 97.4 F (36.3 C), temperature source Oral, resp. rate 18, height 5\' 6"  (1.676 m), weight 244 lb (110.678 kg), last menstrual period 09/01/2012, SpO2 98.00%, unknown if currently breastfeeding.  Physical Exam:  General: alert and no distress Lochia: appropriate Uterine Fundus: firm Incision: healing well DVT Evaluation: No evidence of DVT seen on physical exam.   Recent Labs  06/22/13 0725  HGB 10.9*  HCT 32.5*    Assessment/Plan: Plan for discharge tomorrow   LOS: 1 day   Brock Bad 06/23/2013, 9:10 AM

## 2013-06-24 MED ORDER — IBUPROFEN 600 MG PO TABS: 600.0000 mg | ORAL_TABLET | Freq: Four times a day (QID) | ORAL | Status: DC

## 2013-06-24 NOTE — Discharge Instructions (Signed)

## 2013-06-24 NOTE — Anesthesia Postprocedure Evaluation (Signed)
Anesthesia Post Note  Patient: Emma Gardner  Procedure(s) Performed: * No procedures listed *  Anesthesia type: Epidural  Patient location: Mother/Baby  Post pain: Pain level controlled  Post assessment: Post-op Vital signs reviewed  Last Vitals:  Filed Vitals:   06/24/13 0606  BP: 114/61  Pulse: 98  Temp: 36.6 C  Resp: 17    Post vital signs: Reviewed  Level of consciousness:alert  Complications: No apparent anesthesia complications

## 2013-06-24 NOTE — Discharge Summary (Signed)
Obstetric Discharge Summary Reason for Admission: onset of labor Prenatal Procedures: none Intrapartum Procedures: spontaneous vaginal delivery Postpartum Procedures: none Complications-Operative and Postpartum: 1st degree perineal laceration Hemoglobin  Date Value Ref Range Status  06/22/2013 10.9* 12.0 - 15.0 g/dL Final     HCT  Date Value Ref Range Status  06/22/2013 32.5* 36.0 - 46.0 % Final   Filed Vitals:   06/24/13 0606  BP: 114/61  Pulse: 98  Temp: 97.9 F (36.6 C)  Resp: 17     Physical Exam:  General: alert and cooperative Lochia: appropriate Uterine Fundus: firm Incision: healing well DVT Evaluation: No evidence of DVT seen on physical exam.  Discharge Diagnoses: Term Pregnancy-delivered  Discharge Information: Date: 06/24/2013 Activity: pelvic rest Diet: routine Medications: PNV and Ibuprofen Condition: stable Instructions: refer to practice specific booklet Discharge to: home  Patient to RTC in 2 weeks for Tubal Consult visit.  Newborn Data: Live born female  Birth Weight: 7 lb 7.2 oz (3380 g) APGAR: , 9  Home with mother.  Benson Porcaro Dessa Phi 06/24/2013, 9:17 AM

## 2013-06-29 ENCOUNTER — Encounter (HOSPITAL_COMMUNITY): Payer: Self-pay

## 2013-07-12 ENCOUNTER — Telehealth: Payer: Self-pay | Admitting: *Deleted

## 2013-07-12 NOTE — Telephone Encounter (Signed)
Patient called regarding an appointment for BTL. Patient needs an appointment for consult and post partum with Dr Tamela OddiJackson Moore. Request forwarded to brenda to schedule.

## 2013-07-14 NOTE — Telephone Encounter (Signed)
[id number] - brm - left message for patient to call and schedule appt

## 2013-07-19 NOTE — Telephone Encounter (Signed)
[phone] - 8:47 am - brm - have left several vm for patient to call and schedule appt.

## 2013-07-21 ENCOUNTER — Telehealth: Payer: Self-pay | Admitting: *Deleted

## 2013-07-21 NOTE — Telephone Encounter (Signed)
Pt called into office to try to schedule her tubal ligation.   Return call to pt making her aware that she will have to be scheduled after July 9th due to 30 day papers signing. Pt advised that she will receive a return call in when it is able to be scheduled.  Pt states understanding.

## 2013-08-03 ENCOUNTER — Ambulatory Visit: Payer: Self-pay | Admitting: Obstetrics & Gynecology

## 2013-08-04 ENCOUNTER — Encounter: Payer: Self-pay | Admitting: Obstetrics & Gynecology

## 2013-08-04 ENCOUNTER — Ambulatory Visit (INDEPENDENT_AMBULATORY_CARE_PROVIDER_SITE_OTHER): Payer: Medicaid Other | Admitting: Obstetrics & Gynecology

## 2013-08-04 VITALS — BP 122/86 | HR 61 | Temp 97.6°F | Ht 66.0 in | Wt 218.0 lb

## 2013-08-04 DIAGNOSIS — Z01818 Encounter for other preprocedural examination: Secondary | ICD-10-CM

## 2013-08-05 ENCOUNTER — Other Ambulatory Visit: Payer: Self-pay | Admitting: *Deleted

## 2013-08-05 LAB — PAP IG W/ RFLX HPV ASCU

## 2013-08-05 NOTE — Patient Instructions (Signed)
Laparoscopic Tubal Ligation Laparoscopic tubal ligation is a procedure that closes the fallopian tubes at a time other than right after childbirth. By closing the fallopian tubes, the eggs that are released from the ovaries cannot enter the uterus and sperm cannot reach the egg. Tubal ligation is also known as getting your "tubes tied." Tubal ligation is done so you will not be able to get pregnant or have a baby.  Although this procedure may be reversed, it should be considered permanent and irreversible. If you want to have future pregnancies, you should not have this procedure.  LET YOUR CAREGIVER KNOW ABOUT:  Allergies to food or medicine.  Medicines taken, including vitamins, herbs, eyedrops, over-the-counter medicines, and creams.  Use of steroids (by mouth or creams).  Previous problems with numbing medicines.  History of bleeding problems or blood clots.  Any recent colds or infections.  Previous surgery.  Other health problems, including diabetes and kidney problems.  Possibility of pregnancy, if this applies.  Any past pregnancies. RISKS AND COMPLICATIONS   Infection.  Bleeding.  Injury to surrounding organs.  Anesthetic side effects.  Failure of the procedure.  Ectopic pregnancy.  Future regret about having the procedure done. BEFORE THE PROCEDURE  Do not take aspirin or blood thinners a week before the procedure or as directed. This can cause bleeding.  Do not eat or drink anything 6 to 8 hours before the procedure. PROCEDURE   You may be given a medicine to help you relax (sedative) before the procedure. You will be given a medicine to make you sleep (general anesthetic) during the procedure.  A tube will be put down your throat to help your breath while under general anesthesia.  Two small cuts (incisions) are made in the lower abdominal area and near the belly button.  Your abdominal area will be inflated with a safe gas (carbon dioxide). This helps  give the surgeon room to operate, visualize, and helps the surgeon avoid other organs.  A thin, lighted tube (laparoscope) with a camera attached is inserted into your abdomen through one of the incisions near the belly button. Other small instruments are also inserted through the other abdominal incision.  The fallopian tubes are located and are either blocked with a ring, clip, or are burned (cauterized).  After the fallopian tubes are blocked, the gas is released from the abdomen.  The incisions will be closed with stitches (sutures), and a bandage may be placed over the incisions. AFTER THE PROCEDURE   You will rest in a recovery room for 1--4 hours until you are stable and doing well.  You will also have some mild abdominal discomfort for 3--7 days. You will be given pain medicine to ease any discomfort.  As long as there are no problems, you may be allowed to go home. Someone will need to drive you home and be with you for at least 24 hours once home.  You may have some mild discomfort in the throat. This is from the tube placed in your throat while you were sleeping.  You may experience discomfort in the shoulder area from some trapped air between the liver and diaphragm. This sensation is normal and will slowly go away on its own. Document Released: 04/21/2000 Document Revised: 07/15/2011 Document Reviewed: 04/26/2011 Exit[employer] Patient Information 2015 [employer]. This information is not intended to replace advice given to you by your health care provider. Make sure you discuss any questions you have with your health care provider.  

## 2013-08-05 NOTE — Progress Notes (Signed)
Subjective:     Emma Gardner is a [age] y.o. female who presents for a postpartum visit. She is 6 weeks postpartum following a spontaneous vaginal delivery. I have fully reviewed the prenatal and intrapartum course. The delivery was at term. Outcome: spontaneous vaginal delivery.  Postpartum course has been uncomplicated. Baby's course has been unremarkable.  Bleeding no bleeding. Bowel function is normal. Bladder function is normal. Patient is not sexually active. Contraception method is none. Postpartum depression screening: negative.  The following portions of the patient's history were reviewed and updated as appropriate: allergies, current medications, past family history, past medical history, past social history, past surgical history and problem list.  Review of Systems Pertinent items are noted in HPI.   Objective:    BP 122/86  Pulse 61  Temp(Src) 97.6 F (36.4 C)  Ht 5\' 6"  (1.676 m)  Wt 98.884 kg (218 lb)  BMI 35.20 kg/m2  Breastfeeding? No  General:  alert  Lungs: clear to auscultation bilaterally  Heart:  regular rate and rhythm, S1, S2 normal, no murmur, click, rub or gallop  Abdomen: soft, non-tender; bowel sounds normal; no masses,  no organomegaly   Vulva:  normal  Vagina: normal vagina  Cervix:  no lesions  Corpus: normal  Adnexa:  normal adnexa  Rectal Exam: Not performed.          Assessment:     Normal postpartum exam.  Plan:     Contraception: plans laparoscopic B/L salpingectomies--informed consent obtained

## 2013-08-16 ENCOUNTER — Encounter (HOSPITAL_COMMUNITY): Payer: Self-pay | Admitting: Pharmacy Technician

## 2013-08-22 ENCOUNTER — Encounter (HOSPITAL_COMMUNITY): Payer: Self-pay | Admitting: *Deleted

## 2013-08-24 IMAGING — RF DG HYSTEROGRAM
7 series · 14 of 14 positions shown · IV contrast (omnipaque)
Comparison: none

CLINICAL DATA: Status post Essure micro-insert placement for
contraception.  Evaluate micro-insert location and tubal patency.

HYSTEROSALPINGOGRAM
TECHNIQUE: Following cleansing of the cervix and vagina with
Betadine solution, a hysterosalpingogram was performed using a 5-
French hysterosalpingogram catheter and Omnipaque 300 contrast. The
patient tolerated the examination without difficulty.
Fluoroscopy time: 0.9 minutes

[Series 1: run · 2 of 2 slices shown (1 of 7)]
[im 1/2]
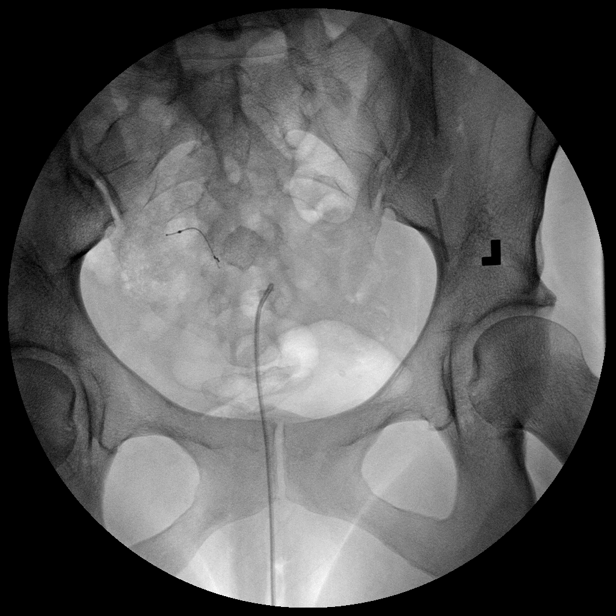
[im 2/2]
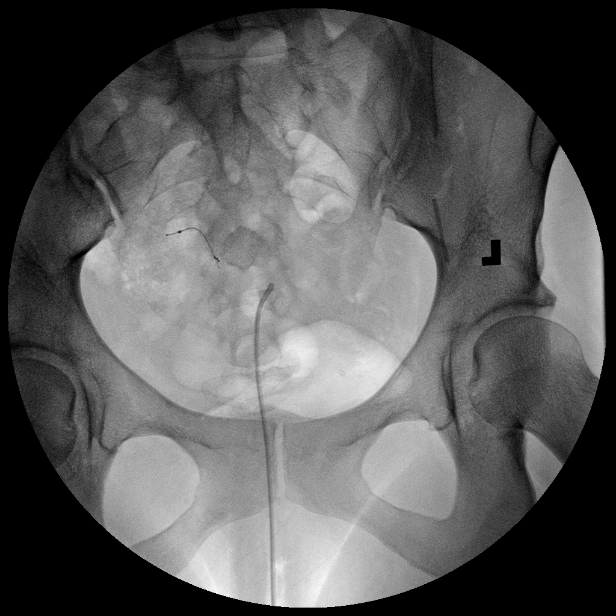

[Series 2: run · 2 of 2 slices shown (2 of 7)]
[im 1/2]
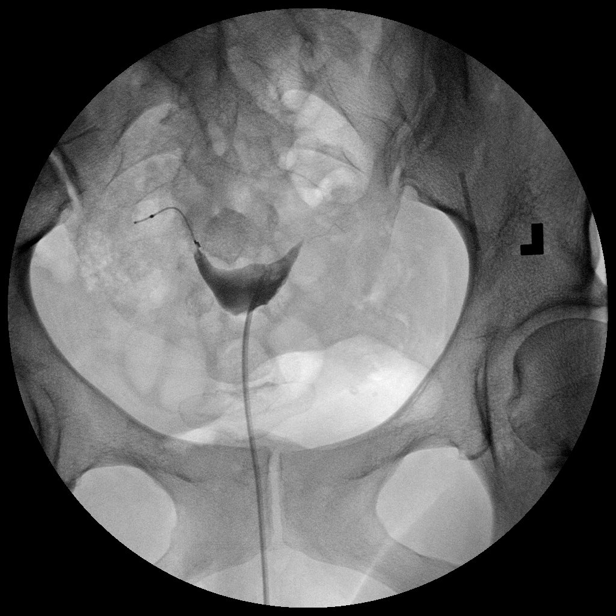
[im 2/2]
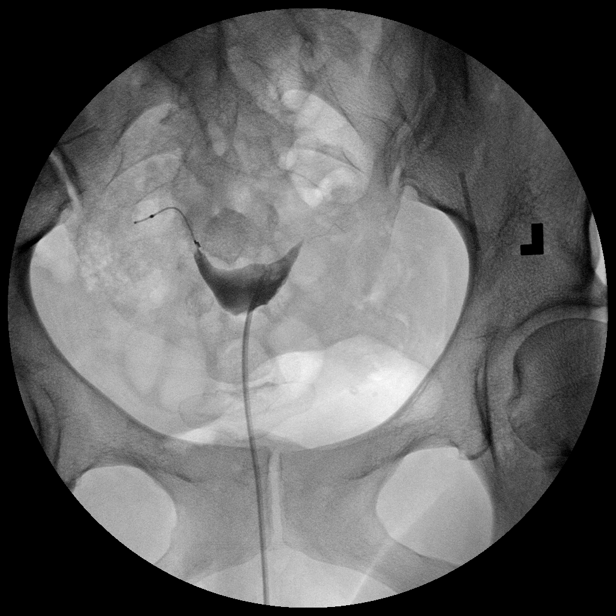

[Series 3: run · 2 of 2 slices shown (3 of 7)]
[im 1/2]
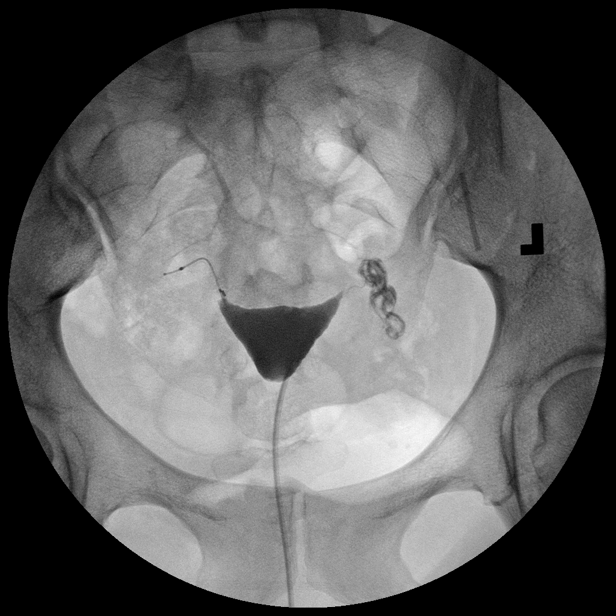
[im 2/2]
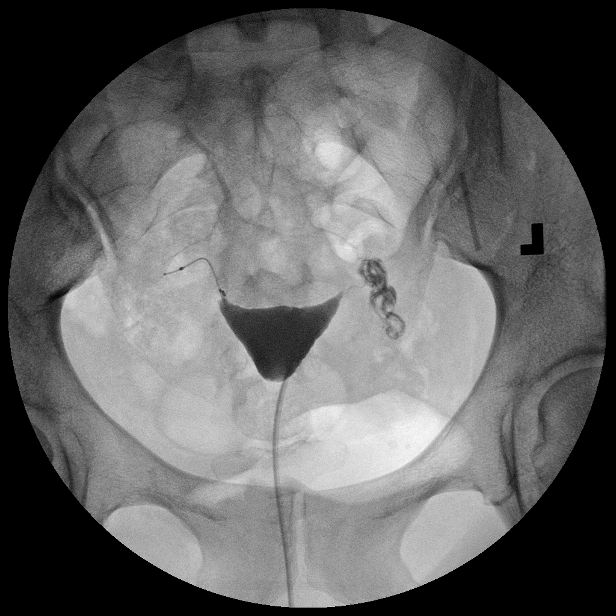

[Series 4: run · 2 of 2 slices shown (4 of 7)]
[im 1/2]
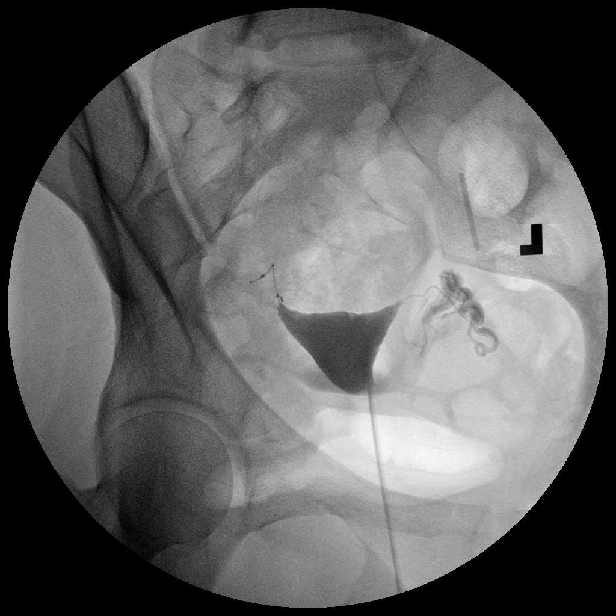
[im 2/2]
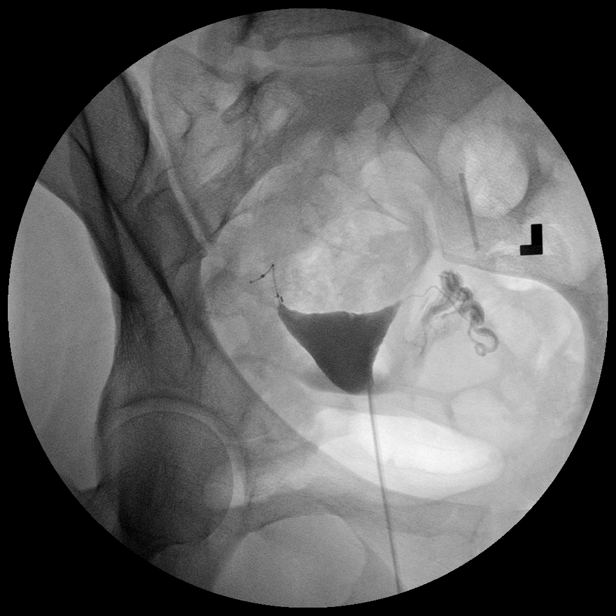

[Series 5: run · 2 of 2 slices shown (5 of 7)]
[im 1/2]
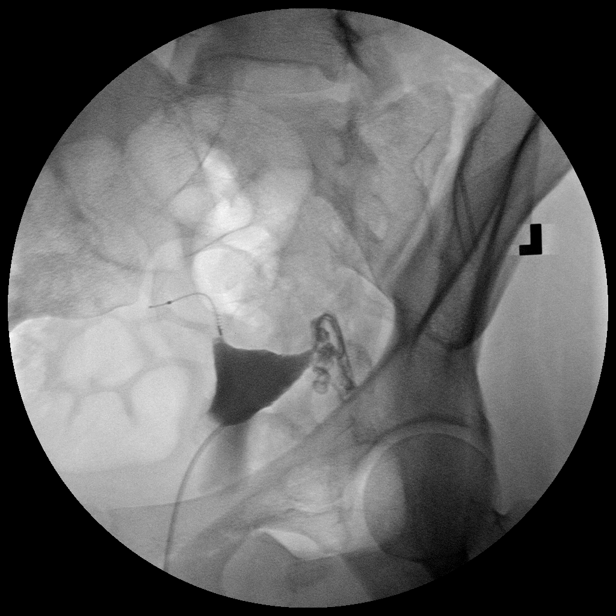
[im 2/2]
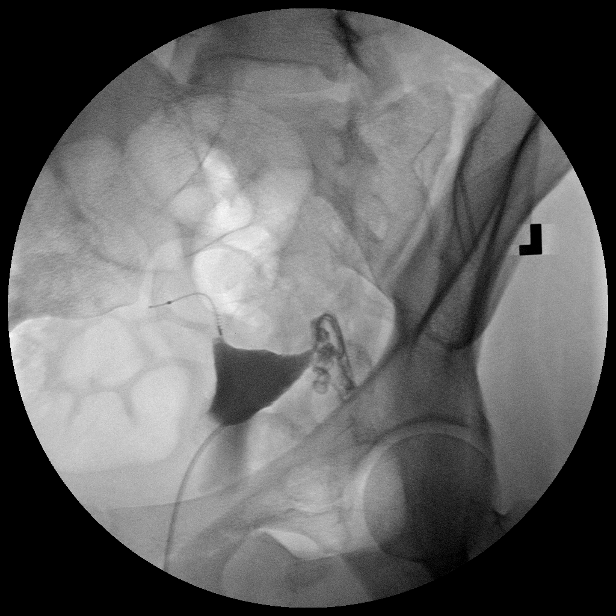

[Series 6: run · 2 of 2 slices shown (6 of 7)]
[im 1/2]
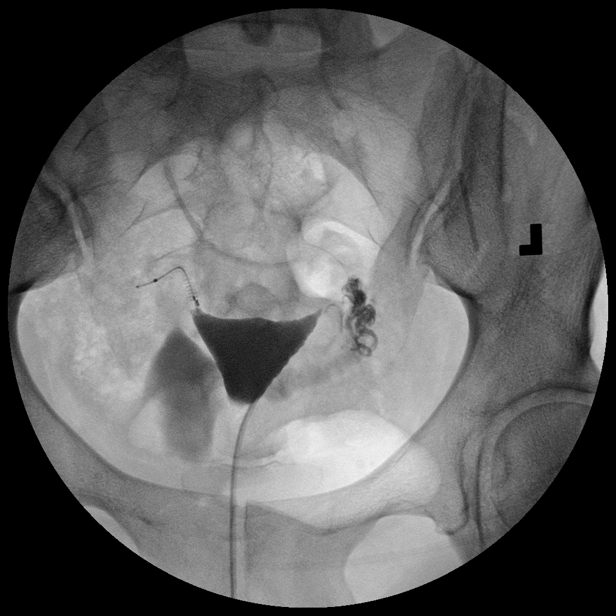
[im 2/2]
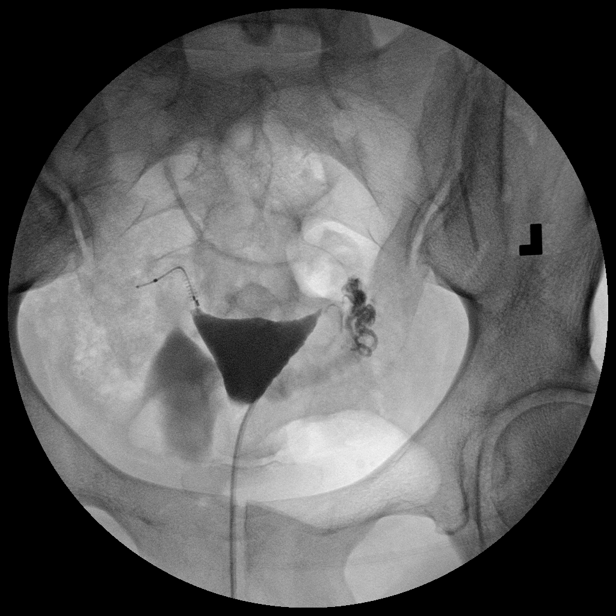

[Series 7: run · 2 of 2 slices shown (7 of 7)]
[im 1/2]
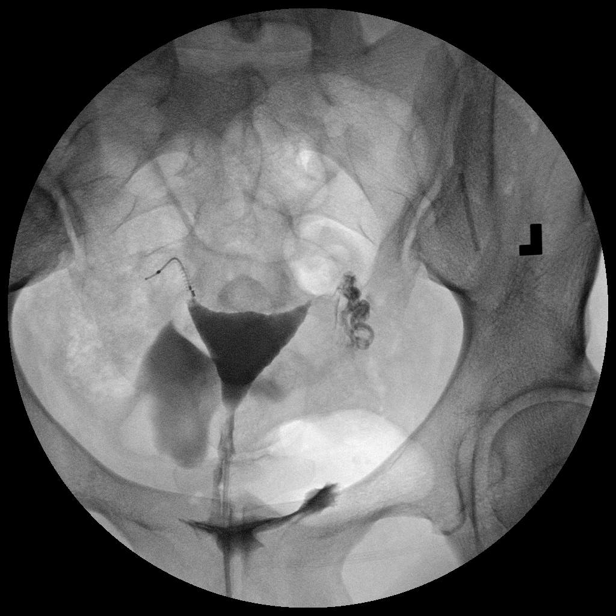
[im 2/2]
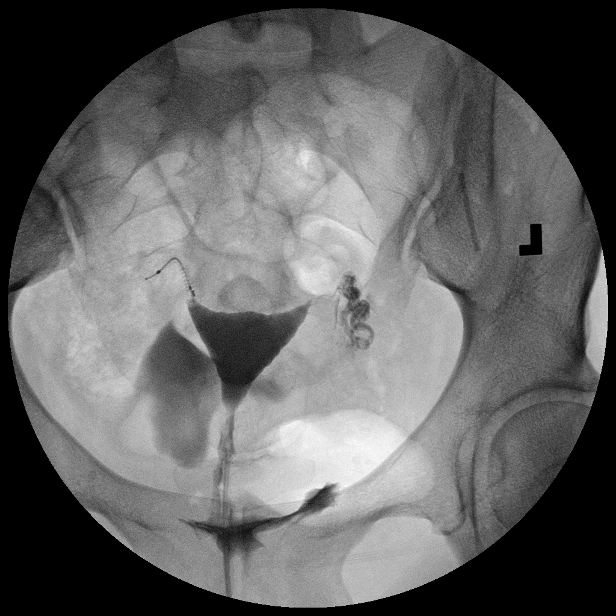

[14 of 14 positions shown; findings below may reference images not displayed]

FINDINGS: The endometrial cavity uterus is normal in appearance.

The right-sided Essure microinsert is seen in satisfactory location
within the right fallopian tube.

The right fallopian tube is occluded at the cornua (category 1).

No left sided Essure micro-insert is visualized within the pelvis.

Contrast opacification of the entire left fallopian tube is seen,
with intraperitoneal spill of contrast demonstrated.  The left
fallopian tube is nondilated and normal in appearance.
IMPRESSION: 1.  Patent left fallopian tube, with no left-sided Essure micro-
insert visualized in the pelvis.  Per manufacturer's
recommendations, the patient should not rely on the Essure micro-
inserts for contraception.
2. Satisfactory location of the right Essure micro-insert, with
occlusion of the right fallopian tube at the cornua.

## 2013-08-24 NOTE — H&P (Signed)
  Chief Complaint: 28 y.o.  who presents for a sterilization procedure.  Details of Present Illness: After counseling, the patient elects to proceed with opportunistic salpingectomies.  LMP 08/17/2013  Breastfeeding? No  Past Medical History  Diagnosis Date  . Ovarian cyst   . Abnormal Pap smear   . Anxiety   . H. pylori infection 2008    ?reinfx - re-tx 03/2012 triple pk  . SVD (spontaneous vaginal delivery)     x 2  . Headache(784.0)     otc med prn  . Ovarian cyst    History   Social History  . Marital Status: Married    Spouse Name: Jannet AskewMichael Vandeventer    Number of Children: 2  . Years of Education: N/A   Occupational History  .  [employer]   Social History Main Topics  . Smoking status: Former Smoker    Types: Cigars    Quit date: 10/04/2010  . Smokeless tobacco: Never Used  . Alcohol Use: No  . Drug Use: No  . Sexual Activity: Not Currently    Partners: Male    Birth Control/ Protection: Surgical     Comment: Pt has essure on patent on right side.   Other Topics Concern  . Not on file   Social History Narrative  . No narrative on file   Family History  Problem Relation Age of Onset  . Dementia Paternal Grandfather   . Dementia Paternal Grandmother   . Depression Mother   . Depression Maternal Grandmother     Pertinent items are noted in HPI.  Pre-Op Diagnosis: Desires sterilization [phone]   Planned Procedure: Procedure(s): LAPAROSCOPIC BILATERAL SALPINGECTOMY  I have reviewed the patient's history and have completed the physical exam and Marolyn HallerOmniya S Howser is acceptable for surgery.  Roseanna RainbowJACKSON-MOORE,Bria Sparr A, MD 08/24/2013 10:22 PM

## 2013-08-26 ENCOUNTER — Ambulatory Visit (HOSPITAL_COMMUNITY)
Admission: RE | Admit: 2013-08-26 | Discharge: 2013-08-26 | Disposition: A | Discharge status: Home / Self Care | Payer: Medicaid Other | Source: Ambulatory Visit | Attending: Obstetrics & Gynecology | Admitting: Obstetrics & Gynecology

## 2013-08-26 ENCOUNTER — Encounter (HOSPITAL_COMMUNITY): Payer: Medicaid Other | Admitting: Anesthesiology

## 2013-08-26 ENCOUNTER — Encounter (HOSPITAL_COMMUNITY): Payer: Self-pay | Admitting: *Deleted

## 2013-08-26 ENCOUNTER — Encounter (HOSPITAL_COMMUNITY)
Admission: RE | Disposition: A | Discharge status: Home / Self Care | Payer: Self-pay | Source: Ambulatory Visit | Attending: Obstetrics & Gynecology

## 2013-08-26 ENCOUNTER — Ambulatory Visit (HOSPITAL_COMMUNITY): Payer: Medicaid Other | Admitting: Anesthesiology

## 2013-08-26 DIAGNOSIS — F411 Generalized anxiety disorder: Secondary | ICD-10-CM | POA: Diagnosis not present

## 2013-08-26 DIAGNOSIS — Z641 Problems related to multiparity: Secondary | ICD-10-CM | POA: Diagnosis not present

## 2013-08-26 DIAGNOSIS — Z302 Encounter for sterilization: Secondary | ICD-10-CM | POA: Insufficient documentation

## 2013-08-26 DIAGNOSIS — R51 Headache: Secondary | ICD-10-CM | POA: Insufficient documentation

## 2013-08-26 DIAGNOSIS — Z87891 Personal history of nicotine dependence: Secondary | ICD-10-CM | POA: Diagnosis not present

## 2013-08-26 HISTORY — PX: LAPAROSCOPIC BILATERAL SALPINGECTOMY: SHX5889

## 2013-08-26 LAB — CBC
HCT: 38.4 % (ref 36.0–46.0)
HEMOGLOBIN: 13.1 g/dL (ref 12.0–15.0)
MCH: 31.3 pg (ref 26.0–34.0)
MCHC: 34.1 g/dL (ref 30.0–36.0)
MCV: 91.9 fL (ref 78.0–100.0)
Platelets: 309 10*3/uL (ref 150–400)
RBC: 4.18 MIL/uL (ref 3.87–5.11)
RDW: 13.3 % (ref 11.5–15.5)
WBC: 7.3 10*3/uL (ref 4.0–10.5)

## 2013-08-26 LAB — PREGNANCY, URINE: PREG TEST UR: NEGATIVE

## 2013-08-26 SURGERY — SALPINGECTOMY, BILATERAL, LAPAROSCOPIC
Anesthesia: General | Site: Abdomen | Laterality: Bilateral

## 2013-08-26 MED ORDER — SODIUM CHLORIDE 0.9 % IJ SOLN: 3.0000 mL | INTRAMUSCULAR | Status: DC | PRN

## 2013-08-26 MED ORDER — MIDAZOLAM HCL 2 MG/2ML IJ SOLN
INTRAMUSCULAR | Status: AC
  Filled 2013-08-26: qty 2

## 2013-08-26 MED ORDER — LACTATED RINGERS IV SOLN
INTRAVENOUS | Status: DC
  Administered 2013-08-26: 11:00:00 via INTRAVENOUS

## 2013-08-26 MED ORDER — MEPERIDINE HCL 25 MG/ML IJ SOLN: 6.2500 mg | INTRAMUSCULAR | Status: DC | PRN

## 2013-08-26 MED ORDER — FENTANYL CITRATE 0.05 MG/ML IJ SOLN
INTRAMUSCULAR | Status: AC
  Filled 2013-08-26: qty 2

## 2013-08-26 MED ORDER — KETOROLAC TROMETHAMINE 30 MG/ML IJ SOLN: 30.0000 mg | Freq: Four times a day (QID) | INTRAMUSCULAR | Status: DC

## 2013-08-26 MED ORDER — ACETAMINOPHEN 650 MG RE SUPP
650.0000 mg | RECTAL | Status: DC | PRN
  Filled 2013-08-26: qty 1

## 2013-08-26 MED ORDER — FENTANYL CITRATE 0.05 MG/ML IJ SOLN
25.0000 ug | INTRAMUSCULAR | Status: DC | PRN
  Administered 2013-08-26 (×3): 50 ug via INTRAVENOUS

## 2013-08-26 MED ORDER — PROPOFOL 10 MG/ML IV BOLUS
INTRAVENOUS | Status: DC | PRN
  Administered 2013-08-26: 160 mg via INTRAVENOUS

## 2013-08-26 MED ORDER — NEOSTIGMINE METHYLSULFATE 10 MG/10ML IV SOLN
INTRAVENOUS | Status: DC | PRN
  Administered 2013-08-26: 3 mg via INTRAVENOUS

## 2013-08-26 MED ORDER — GLYCOPYRROLATE 0.2 MG/ML IJ SOLN
INTRAMUSCULAR | Status: DC | PRN
  Administered 2013-08-26: 0.4 mg via INTRAVENOUS

## 2013-08-26 MED ORDER — LIDOCAINE HCL (CARDIAC) 20 MG/ML IV SOLN
INTRAVENOUS | Status: DC | PRN
  Administered 2013-08-26: 50 mg via INTRAVENOUS

## 2013-08-26 MED ORDER — SODIUM CHLORIDE 0.9 % IV SOLN: 250.0000 mL | INTRAVENOUS | Status: DC | PRN

## 2013-08-26 MED ORDER — ONDANSETRON HCL 4 MG/2ML IJ SOLN
INTRAMUSCULAR | Status: DC | PRN
  Administered 2013-08-26: 4 mg via INTRAVENOUS

## 2013-08-26 MED ORDER — KETOROLAC TROMETHAMINE 30 MG/ML IJ SOLN
INTRAMUSCULAR | Status: DC | PRN
  Administered 2013-08-26: 30 mg via INTRAVENOUS

## 2013-08-26 MED ORDER — DEXAMETHASONE SODIUM PHOSPHATE 10 MG/ML IJ SOLN
INTRAMUSCULAR | Status: AC
  Filled 2013-08-26: qty 1

## 2013-08-26 MED ORDER — FENTANYL CITRATE 0.05 MG/ML IJ SOLN
INTRAMUSCULAR | Status: DC | PRN
  Administered 2013-08-26: 50 ug via INTRAVENOUS
  Administered 2013-08-26 (×2): 100 ug via INTRAVENOUS

## 2013-08-26 MED ORDER — SCOPOLAMINE 1 MG/3DAYS TD PT72
MEDICATED_PATCH | TRANSDERMAL | Status: AC
  Filled 2013-08-26: qty 1

## 2013-08-26 MED ORDER — KETOROLAC TROMETHAMINE 30 MG/ML IJ SOLN
INTRAMUSCULAR | Status: AC
  Filled 2013-08-26: qty 1

## 2013-08-26 MED ORDER — ROCURONIUM BROMIDE 100 MG/10ML IV SOLN
INTRAVENOUS | Status: DC | PRN
  Administered 2013-08-26: 40 mg via INTRAVENOUS

## 2013-08-26 MED ORDER — ONDANSETRON HCL 4 MG/2ML IJ SOLN
INTRAMUSCULAR | Status: AC
  Filled 2013-08-26: qty 2

## 2013-08-26 MED ORDER — PROPOFOL 10 MG/ML IV EMUL
INTRAVENOUS | Status: AC
  Filled 2013-08-26: qty 20

## 2013-08-26 MED ORDER — SODIUM CHLORIDE 0.9 % IJ SOLN
3.0000 mL | Freq: Two times a day (BID) | INTRAMUSCULAR | Status: DC
Start: 2013-08-26 — End: 2013-08-26

## 2013-08-26 MED ORDER — TRAMADOL HCL 50 MG PO TABS: 100.0000 mg | ORAL_TABLET | Freq: Four times a day (QID) | ORAL | Status: DC | PRN

## 2013-08-26 MED ORDER — LIDOCAINE HCL (CARDIAC) 20 MG/ML IV SOLN
INTRAVENOUS | Status: AC
  Filled 2013-08-26: qty 5

## 2013-08-26 MED ORDER — FENTANYL CITRATE 0.05 MG/ML IJ SOLN: 25.0000 ug | INTRAMUSCULAR | Status: DC | PRN

## 2013-08-26 MED ORDER — MIDAZOLAM HCL 2 MG/2ML IJ SOLN: 0.5000 mg | Freq: Once | INTRAMUSCULAR | Status: DC | PRN

## 2013-08-26 MED ORDER — PROMETHAZINE HCL 25 MG/ML IJ SOLN
INTRAMUSCULAR | Status: AC
  Filled 2013-08-26: qty 1

## 2013-08-26 MED ORDER — GLYCOPYRROLATE 0.2 MG/ML IJ SOLN
INTRAMUSCULAR | Status: DC | PRN
  Administered 2013-08-26: 0.2 mg via INTRAVENOUS

## 2013-08-26 MED ORDER — FENTANYL CITRATE 0.05 MG/ML IJ SOLN
INTRAMUSCULAR | Status: AC
  Filled 2013-08-26: qty 5

## 2013-08-26 MED ORDER — DEXAMETHASONE SODIUM PHOSPHATE 10 MG/ML IJ SOLN
INTRAMUSCULAR | Status: DC | PRN
  Administered 2013-08-26: 8 mg via INTRAVENOUS

## 2013-08-26 MED ORDER — OXYCODONE HCL 5 MG PO TABS: 5.0000 mg | ORAL_TABLET | ORAL | Status: DC | PRN

## 2013-08-26 MED ORDER — SCOPOLAMINE 1 MG/3DAYS TD PT72
1.0000 | MEDICATED_PATCH | Freq: Once | TRANSDERMAL | Status: DC
  Administered 2013-08-26: 1.5 mg via TRANSDERMAL

## 2013-08-26 MED ORDER — IBUPROFEN 800 MG PO TABS: 800.0000 mg | ORAL_TABLET | Freq: Three times a day (TID) | ORAL | Status: DC | PRN

## 2013-08-26 MED ORDER — NEOSTIGMINE METHYLSULFATE 10 MG/10ML IV SOLN
INTRAVENOUS | Status: AC
  Filled 2013-08-26: qty 1

## 2013-08-26 MED ORDER — GLYCOPYRROLATE 0.2 MG/ML IJ SOLN
INTRAMUSCULAR | Status: AC
  Filled 2013-08-26: qty 2

## 2013-08-26 MED ORDER — KETOROLAC TROMETHAMINE 30 MG/ML IJ SOLN: 15.0000 mg | Freq: Once | INTRAMUSCULAR | Status: DC | PRN

## 2013-08-26 MED ORDER — PROMETHAZINE HCL 25 MG/ML IJ SOLN
6.2500 mg | INTRAMUSCULAR | Status: DC | PRN
  Administered 2013-08-26: 6.25 mg via INTRAVENOUS

## 2013-08-26 MED ORDER — MIDAZOLAM HCL 2 MG/2ML IJ SOLN
INTRAMUSCULAR | Status: DC | PRN
  Administered 2013-08-26: 2 mg via INTRAVENOUS

## 2013-08-26 MED ORDER — ACETAMINOPHEN 325 MG PO TABS: 650.0000 mg | ORAL_TABLET | ORAL | Status: DC | PRN

## 2013-08-26 SURGICAL SUPPLY — 33 items
ADH SKN CLS APL DERMABOND .7 (GAUZE/BANDAGES/DRESSINGS) ×1
BAG SPEC RTRVL LRG 6X4 10 (ENDOMECHANICALS)
BLADE SURG 11 STRL SS (BLADE) ×3 IMPLANT
CABLE HIGH FREQUENCY MONO STRZ (ELECTRODE) ×2 IMPLANT
CATH ROBINSON RED A/P 16FR (CATHETERS) ×3 IMPLANT
CHLORAPREP W/TINT 26ML (MISCELLANEOUS) ×3 IMPLANT
CLOTH BEACON ORANGE TIMEOUT ST (SAFETY) ×3 IMPLANT
CONT SPECI 4OZ STER CLIK (MISCELLANEOUS) IMPLANT
DERMABOND ADVANCED (GAUZE/BANDAGES/DRESSINGS) ×2
DERMABOND ADVANCED .7 DNX12 (GAUZE/BANDAGES/DRESSINGS) IMPLANT
DRSG COVADERM PLUS 2X2 (GAUZE/BANDAGES/DRESSINGS) ×4 IMPLANT
DRSG OPSITE POSTOP 3X4 (GAUZE/BANDAGES/DRESSINGS) ×2 IMPLANT
EVACUATOR SMOKE 8.L (FILTER) ×3 IMPLANT
GLOVE BIO SURGEON STRL SZ 6.5 (GLOVE) ×4 IMPLANT
GLOVE BIO SURGEONS STRL SZ 6.5 (GLOVE) ×2
GOWN STRL REUS W/TWL LRG LVL3 (GOWN DISPOSABLE) ×6 IMPLANT
NS IRRIG 1000ML POUR BTL (IV SOLUTION) ×3 IMPLANT
PACK LAPAROSCOPY BASIN (CUSTOM PROCEDURE TRAY) ×3 IMPLANT
POUCH SPECIMEN RETRIEVAL 10MM (ENDOMECHANICALS) IMPLANT
PROTECTOR NERVE ULNAR (MISCELLANEOUS) ×3 IMPLANT
SCRUB PCMX 4 OZ (MISCELLANEOUS) ×3 IMPLANT
SEALER TISSUE G2 CVD JAW 35 (ENDOMECHANICALS) IMPLANT
SEALER TISSUE G2 CVD JAW 45CM (ENDOMECHANICALS)
SET IRRIG TUBING LAPAROSCOPIC (IRRIGATION / IRRIGATOR) IMPLANT
SUT MNCRL AB 4-0 PS2 18 (SUTURE) IMPLANT
SUT VICRYL 0 UR6 27IN ABS (SUTURE) ×3 IMPLANT
SUT VICRYL 4-0 PS2 18IN ABS (SUTURE) IMPLANT
TOWEL OR 17X24 6PK STRL BLUE (TOWEL DISPOSABLE) ×6 IMPLANT
TRAY FOLEY CATH 14FR (SET/KITS/TRAYS/PACK) IMPLANT
TROCAR XCEL NON-BLD 11X100MML (ENDOMECHANICALS) ×3 IMPLANT
TROCAR XCEL NON-BLD 5MMX100MML (ENDOMECHANICALS) ×6 IMPLANT
WARMER LAPAROSCOPE (MISCELLANEOUS) ×3 IMPLANT
WATER STERILE IRR 1000ML POUR (IV SOLUTION) ×3 IMPLANT

## 2013-08-26 NOTE — Discharge Instructions (Addendum)

## 2013-08-26 NOTE — Anesthesia Preprocedure Evaluation (Signed)
Anesthesia Evaluation  Patient identified by MRN, date of birth, ID band Patient awake    Reviewed: Allergy & Precautions, H&P , Patient's Chart, lab work & pertinent test results, reviewed documented beta blocker date and time   History of Anesthesia Complications Negative for: history of anesthetic complications  Airway Mallampati: II TM Distance: >3 FB Neck ROM: full    Dental   Pulmonary former smoker,  breath sounds clear to auscultation        Cardiovascular Exercise Tolerance: Good Rhythm:regular Rate:Normal     Neuro/Psych  Headaches,    GI/Hepatic   Endo/Other    Renal/GU      Musculoskeletal   Abdominal   Peds  Hematology   Anesthesia Other Findings   Reproductive/Obstetrics                           Anesthesia Physical Anesthesia Plan  ASA: II  Anesthesia Plan: General ETT   Post-op Pain Management:    Induction:   Airway Management Planned:   Additional Equipment:   Intra-op Plan:   Post-operative Plan:   Informed Consent: I have reviewed the patients History and Physical, chart, labs and discussed the procedure including the risks, benefits and alternatives for the proposed anesthesia with the patient or authorized representative who has indicated his/her understanding and acceptance.   Dental Advisory Given  Plan Discussed with: CRNA and Surgeon  Anesthesia Plan Comments:         Anesthesia Quick Evaluation

## 2013-08-26 NOTE — Interval H&P Note (Signed)
History and Physical Interval Note:  08/26/2013 11:13 AM  Emma Gardner  has presented today for surgery, with the diagnosis of Desires sterilization [phone]  The various methods of treatment have been discussed with the patient and family. After consideration of risks, benefits and other options for treatment, the patient has consented to  Procedure(s): LAPAROSCOPIC BILATERAL SALPINGECTOMY (Bilateral) as a surgical intervention .  The patient's history has been reviewed, patient examined, no change in status, stable for surgery.  I have reviewed the patient's chart and labs.  Questions were answered to the patient's satisfaction.     JACKSON-MOORE,Danisha Brassfield A

## 2013-08-26 NOTE — Anesthesia Postprocedure Evaluation (Signed)
  Anesthesia Post-op Note  Anesthesia Post Note  Patient: Emma Gardner  Procedure(s) Performed: Procedure(s) (LRB): LAPAROSCOPIC BILATERAL SALPINGECTOMY (Bilateral)  Anesthesia type: General  Patient location: PACU  Post pain: Pain level controlled  Post assessment: Post-op Vital signs reviewed  Last Vitals:  Filed Vitals:   08/26/13 1400  BP: 111/65  Pulse: 50  Temp: 36.7 C  Resp: 13    Post vital signs: Reviewed  Level of consciousness: sedated  Complications: No apparent anesthesia complications

## 2013-08-26 NOTE — Transfer of Care (Signed)
Immediate Anesthesia Transfer of Care Note  Patient: Emma Gardner  Procedure(s) Performed: Procedure(s): LAPAROSCOPIC BILATERAL SALPINGECTOMY (Bilateral)  Patient Location: PACU  Anesthesia Type:General  Level of Consciousness: awake, alert  and oriented  Airway & Oxygen Therapy: Patient Spontanous Breathing and Patient connected to nasal cannula oxygen  Post-op Assessment: Report given to PACU RN and Post -op Vital signs reviewed and stable  Post vital signs: Reviewed and stable  Complications: No apparent anesthesia complications

## 2013-08-26 NOTE — Op Note (Signed)
Procedure Note  Emma HallerOmniya S Malinak 28 y.o. 08/26/2013  Preoperative Diagnosis:  Multiparity, desires a sterilization procedure  Postoperative Diagnosis: Same  Procedure: Laparoscopic bilateral salpingectomy  Surgeon: Antionette CharJACKSON-MOORE,Kalisi Bevill A  Indications:  The patient now presents for a sterilization procedure after discussing therapeutic alternatives.        Procedure Detail:  The patient was taken to the operating room and was placed on the operating table in the dorsal supine position.  After his satisfactory general anesthesia was achieved, the patient was placed in the semi-lithotomy position using Allen stirrups. The patient was prepped and draped in the usual sterile manner for vaginal laparoscopic procedure. A speculum was placed in the vagina. The anterior lip of the cervix was grasped with a single-tooth tenaculum. A Hulka manipulator was then advanced into the uterus and secured  to the anterior lip of the cervix as a means to manipulate the uterus. The single-tooth tenaculum and Hulka manipulator were then removed. A small incision was made to the skin and subcutaneous tissue. A 5 mm Optiview trocar was placed through the incision into the abdominal cavity diagnostic laparoscope with video camera attached was placed through the trocar sleeve and carbon dioxide was used to insufflate the abdominal and pelvic cavity. The pelvic contents were examined and the findings were described below.   Bilateral 5 mm ports and a 10 mm suprapubic port were all placed under direct visualization.  The proximal left fallopian tube was coagulated and divided with the Enseal vessel sealer.  The mesosalpinx was divided with the enseal vessel sealer.  The The fallopian tube was removed from the abdomen through the suprapubic port.  The right fallopian tube was manipulated in a similar fashion.  Hemostasis was noted.  The scope was removed and the excess carbon dioxide was remove through the port, before it was  removed.  The subcutaneous layer of the suprapubic incision was reapproximated with an interrupted figure-of eight 0-Vicryl suture on a UR 6 needle. The skin was reapproximated with running subcuticluar stitches of 4-0 Vicryl suture. A skin adhesive was applied to the incisions.  The instruments were removed from the vagina and there was minimal bleeding from the cervix. Final sponge, instrument and needle counts were correct. The patient was awakened on the operating table and taken to the PACU in satisfactory condition.    Findings: Normal uterus, fallopian tubes and ovaries  Estimated Blood Loss:  Minimal              Total IV Fluids: per Anesthesiology        Condition: stable

## 2013-08-29 ENCOUNTER — Encounter (HOSPITAL_COMMUNITY): Payer: Self-pay | Admitting: Obstetrics & Gynecology

## 2013-08-29 ENCOUNTER — Telehealth: Payer: Self-pay | Admitting: *Deleted

## 2013-08-29 NOTE — Telephone Encounter (Signed)
Patient called and thinks she has had a reaction to her Ultram. She is having itching. She states she called the hospital and they told her to ask for Vicodin. Per Dr Clearance CootsHarper- he thought she could not take all the other alternatives and he will have to review her chart. 6:03 Dr Clearance CootsHarper has left and the patient number is busy when I tried to call her.

## 2013-08-30 ENCOUNTER — Other Ambulatory Visit: Payer: Self-pay | Admitting: Obstetrics

## 2013-08-30 DIAGNOSIS — R52 Pain, unspecified: Secondary | ICD-10-CM

## 2013-08-30 MED ORDER — HYDROCODONE-ACETAMINOPHEN 5-325 MG PO TABS: 1.0000 | ORAL_TABLET | Freq: Four times a day (QID) | ORAL | Status: DC | PRN

## 2013-09-15 ENCOUNTER — Ambulatory Visit (INDEPENDENT_AMBULATORY_CARE_PROVIDER_SITE_OTHER): Payer: Medicaid Other | Admitting: Obstetrics & Gynecology

## 2013-09-15 ENCOUNTER — Encounter: Payer: Self-pay | Admitting: Obstetrics & Gynecology

## 2013-09-15 VITALS — BP 117/80 | HR 66 | Temp 98.1°F | Ht 65.0 in | Wt 214.0 lb

## 2013-09-15 DIAGNOSIS — Z09 Encounter for follow-up examination after completed treatment for conditions other than malignant neoplasm: Secondary | ICD-10-CM

## 2013-09-16 NOTE — Progress Notes (Signed)
Subjective:     Emma HallerOmniya S Gardner is a 28 y.o. female who presents to the clinic 3 weeks status post laparoscopic bilateral salpingectomies for sterilization. Eating a regular diet without difficulty. Bowel movements are normal. The patient is not having any pain.  The following portions of the patient's history were reviewed and updated as appropriate: allergies, current medications, past family history, past medical history, past social history, past surgical history and problem list.  Review of Systems Pertinent items are noted in HPI.    Objective:    BP 117/80  Pulse 66  Temp(Src) 98.1 F (36.7 C)  Ht 5\' 5"  (1.651 m)  Wt 97.07 kg (214 lb)  BMI 35.61 kg/m2  LMP 08/19/2013  Breastfeeding? No General:  alert  Abdomen: soft, bowel sounds active, non-tender  Incision:   healing well, no drainage, no erythema, no hernia, no seroma, no swelling, no dehiscence, incision well approximated       Assessment:    Doing well postoperatively. Operative findings again reviewed. Pathology report discussed.    Plan:    1. Continue any current medications. 2. Wound care discussed. 3. Activity restrictions: no intercourse 4. Anticipated return to work: now. 5. Follow up: 1 month.

## 2013-10-05 ENCOUNTER — Encounter (HOSPITAL_COMMUNITY): Payer: Self-pay | Admitting: Emergency Medicine

## 2013-10-05 ENCOUNTER — Telehealth: Payer: Self-pay | Admitting: Gastroenterology

## 2013-10-05 ENCOUNTER — Emergency Department (HOSPITAL_COMMUNITY): Payer: Medicaid Other

## 2013-10-05 ENCOUNTER — Emergency Department (HOSPITAL_COMMUNITY)
Admission: EM | Admit: 2013-10-05 | Discharge: 2013-10-05 | Disposition: A | Discharge status: Home / Self Care | Payer: Medicaid Other | Attending: Emergency Medicine | Admitting: Emergency Medicine

## 2013-10-05 DIAGNOSIS — Z8619 Personal history of other infectious and parasitic diseases: Secondary | ICD-10-CM | POA: Insufficient documentation

## 2013-10-05 DIAGNOSIS — Z3202 Encounter for pregnancy test, result negative: Secondary | ICD-10-CM | POA: Insufficient documentation

## 2013-10-05 DIAGNOSIS — Z8742 Personal history of other diseases of the female genital tract: Secondary | ICD-10-CM | POA: Insufficient documentation

## 2013-10-05 DIAGNOSIS — Z8659 Personal history of other mental and behavioral disorders: Secondary | ICD-10-CM | POA: Insufficient documentation

## 2013-10-05 DIAGNOSIS — Z87891 Personal history of nicotine dependence: Secondary | ICD-10-CM | POA: Insufficient documentation

## 2013-10-05 DIAGNOSIS — R109 Unspecified abdominal pain: Secondary | ICD-10-CM | POA: Insufficient documentation

## 2013-10-05 LAB — COMPREHENSIVE METABOLIC PANEL
ALK PHOS: 52 U/L (ref 39–117)
ALT: 14 U/L (ref 0–35)
AST: 16 U/L (ref 0–37)
Albumin: 3.8 g/dL (ref 3.5–5.2)
Anion gap: 12 (ref 5–15)
BUN: 12 mg/dL (ref 6–23)
CHLORIDE: 103 meq/L (ref 96–112)
CO2: 25 meq/L (ref 19–32)
CREATININE: 0.87 mg/dL (ref 0.50–1.10)
Calcium: 9.6 mg/dL (ref 8.4–10.5)
GFR calc Af Amer: >90 mL/min (ref >90)
GFR, EST NON AFRICAN AMERICAN: 89 mL/min — AB (ref >90)
Glucose, Bld: 107 mg/dL — ABNORMAL HIGH (ref 70–99)
Potassium: 4.1 mEq/L (ref 3.7–5.3)
Sodium: 140 mEq/L (ref 137–147)
Total Protein: 7 g/dL (ref 6.0–8.3)

## 2013-10-05 LAB — URINALYSIS, ROUTINE W REFLEX MICROSCOPIC
GLUCOSE, UA: NEGATIVE mg/dL
Hgb urine dipstick: NEGATIVE
Ketones, ur: NEGATIVE mg/dL
Leukocytes, UA: NEGATIVE
Nitrite: NEGATIVE
Protein, ur: NEGATIVE mg/dL
Specific Gravity, Urine: 1.037 — ABNORMAL HIGH (ref 1.005–1.030)
Urobilinogen, UA: 0.2 mg/dL (ref 0.0–1.0)
pH: 5.5 (ref 5.0–8.0)

## 2013-10-05 LAB — CBC WITH DIFFERENTIAL/PLATELET
BASOS ABS: 0 10*3/uL (ref 0.0–0.1)
Basophils Relative: 0 % (ref 0–1)
Eosinophils Absolute: 0.4 10*3/uL (ref 0.0–0.7)
Eosinophils Relative: 2 % (ref 0–5)
HCT: 42.5 % (ref 36.0–46.0)
HEMOGLOBIN: 14.2 g/dL (ref 12.0–15.0)
LYMPHS PCT: 17 % (ref 12–46)
Lymphs Abs: 3.2 10*3/uL (ref 0.7–4.0)
MCH: 30.6 pg (ref 26.0–34.0)
MCHC: 33.4 g/dL (ref 30.0–36.0)
MCV: 91.6 fL (ref 78.0–100.0)
Monocytes Absolute: 0.9 10*3/uL (ref 0.1–1.0)
Monocytes Relative: 5 % (ref 3–12)
NEUTROS ABS: 14.2 10*3/uL — AB (ref 1.7–7.7)
Neutrophils Relative %: 76 % (ref 43–77)
Platelets: 371 10*3/uL (ref 150–400)
RBC: 4.64 MIL/uL (ref 3.87–5.11)
RDW: 13.2 % (ref 11.5–15.5)
WBC: 18.8 10*3/uL — AB (ref 4.0–10.5)

## 2013-10-05 LAB — LIPASE, BLOOD: Lipase: 252 U/L — ABNORMAL HIGH (ref 11–59)

## 2013-10-05 LAB — PREGNANCY, URINE: Preg Test, Ur: NEGATIVE

## 2013-10-05 MED ORDER — ONDANSETRON 8 MG PO TBDP: 8.0000 mg | ORAL_TABLET | Freq: Three times a day (TID) | ORAL | Status: DC | PRN

## 2013-10-05 MED ORDER — ONDANSETRON HCL 4 MG/2ML IJ SOLN
4.0000 mg | Freq: Once | INTRAMUSCULAR | Status: AC
  Administered 2013-10-05: 4 mg via INTRAVENOUS
  Filled 2013-10-05: qty 2

## 2013-10-05 MED ORDER — MORPHINE SULFATE 4 MG/ML IJ SOLN
6.0000 mg | Freq: Once | INTRAMUSCULAR | Status: AC
  Administered 2013-10-05: 6 mg via INTRAVENOUS
  Filled 2013-10-05: qty 2

## 2013-10-05 MED ORDER — SODIUM CHLORIDE 0.9 % IV SOLN
1000.0000 mL | Freq: Once | INTRAVENOUS | Status: AC
  Administered 2013-10-05: 1000 mL via INTRAVENOUS

## 2013-10-05 MED ORDER — OMEPRAZOLE 20 MG PO CPDR: 20.0000 mg | DELAYED_RELEASE_CAPSULE | Freq: Two times a day (BID) | ORAL | Status: DC

## 2013-10-05 MED ORDER — BIS SUBCIT-METRONID-TETRACYC 140-125-125 MG PO CAPS: 3.0000 | ORAL_CAPSULE | Freq: Three times a day (TID) | ORAL | Status: DC

## 2013-10-05 MED ORDER — SODIUM CHLORIDE 0.9 % IV SOLN: 1000.0000 mL | INTRAVENOUS | Status: DC

## 2013-10-05 NOTE — ED Notes (Signed)
Pt reports generalized abd pain with n/v since yesterday.  Pt reports recently getting her tubes tied.

## 2013-10-05 NOTE — ED Notes (Signed)
US at bedside

## 2013-10-05 NOTE — ED Notes (Signed)
Patient c/o abd pain x2 days. Patient reports 3 episodes of emesis during that time. Patient c/o nausea at this time.

## 2013-10-06 MED ORDER — CIPROFLOXACIN HCL 500 MG PO TABS: 500.0000 mg | ORAL_TABLET | Freq: Two times a day (BID) | ORAL | Status: DC

## 2013-10-06 MED ORDER — AMOXICILLIN 500 MG PO TABS: 1000.0000 mg | ORAL_TABLET | Freq: Two times a day (BID) | ORAL | Status: DC

## 2013-10-06 NOTE — ED Provider Notes (Addendum)
CSN: [id number]     Arrival date & time 10/05/13  1478 History   First MD Initiated Contact with Patient 10/05/13 [phone]     Chief Complaint  Patient presents with  . Abdominal Pain  . Emesis      HPI Patient reports developing mild generalized abdominal pain as well as associated nausea and vomiting since ingestion.  She denies hematemesis.  She's had 2 episodes of similar pain before in the past and both times they represent H. pylori infections.  She has a gastroenterologist.  She reports mild decreased oral intake today secondary to nausea.  No fevers or chills.  No history of pancreatitis.  Denies alcohol use.  No history of inflammatory bowel disease.  Denies back pain or flank pain.  No urinary complaints.  No abnormal vaginal bleeding.   Past Medical History  Diagnosis Date  . Ovarian cyst   . Abnormal Pap smear   . Anxiety   . H. pylori infection 2008    ?reinfx - re-tx 03/2012 triple pk  . SVD (spontaneous vaginal delivery)     x 2  . Headache(784.0)     otc med prn  . Ovarian cyst    Past Surgical History  Procedure Laterality Date  . Endometrial biopsy      normal  . Cervical biopsy    . Essure tubal ligation  07/2011  . Tubal ligation    . Laparoscopy N/A 06/04/2012    Procedure: LAPAROSCOPY DIAGNOSTIC;  Surgeon: Antionette Char, MD;  Location: [facility];  Service: Gynecology;  Laterality: N/A;  . Laparoscopic bilateral salpingectomy Bilateral 08/26/2013    Procedure: LAPAROSCOPIC BILATERAL SALPINGECTOMY;  Surgeon: Antionette Char, MD;  Location: [facility];  Service: Gynecology;  Laterality: Bilateral;   Family History  Problem Relation Age of Onset  . Dementia Paternal Grandfather   . Dementia Paternal Grandmother   . Depression Mother   . Depression Maternal Grandmother    History  Substance Use Topics  . Smoking status: Former Smoker    Types: Cigars    Quit date: 10/04/2010  . Smokeless tobacco: Never Used  . Alcohol Use: No   OB History   Grav Para  Term Preterm Abortions TAB SAB Ect Mult Living   Review of Systems  All other systems reviewed and are negative.     Allergies  Percocet  Home Medications   Prior to Admission medications   Medication Sig Start Date End Date Taking? Authorizing Provider  ibuprofen (ADVIL,MOTRIN) 800 MG tablet Take 1 tablet (800 mg total) by mouth every 8 (eight) hours as needed. 08/26/13  Yes Brock Bad, MD  bismuth-metronidazole-tetracycline [facility]) [phone] MG per capsule Take 3 capsules by mouth 4 (four) times daily -  before meals and at bedtime. 10/05/13   Lyanne Co, MD  omeprazole (PRILOSEC) 20 MG capsule Take 1 capsule (20 mg total) by mouth 2 (two) times daily before a meal. 10/05/13   Lyanne Co, MD  ondansetron (ZOFRAN ODT) 8 MG disintegrating tablet Take 1 tablet (8 mg total) by mouth every 8 (eight) hours as needed for nausea or vomiting. 10/05/13   Lyanne Co, MD   BP 111/60  Pulse 56  Temp(Src) 97.3 F (36.3 C) (Oral)  Resp 20  SpO2 99%  LMP 08/19/2013 Physical Exam  Nursing note and vitals reviewed. Constitutional: She is oriented to person, place, and time. She appears well-developed and well-nourished.  No distress.  HENT:  Head: Normocephalic and atraumatic.  Eyes: EOM are normal.  Neck: Normal range of motion.  Cardiovascular: Normal rate, regular rhythm and normal heart sounds.   Pulmonary/Chest: Effort normal and breath sounds normal.  Abdominal: Soft. She exhibits no distension. There is no tenderness. There is no rebound and no guarding.  Musculoskeletal: Normal range of motion.  Neurological: She is alert and oriented to person, place, and time.  Skin: Skin is warm and dry.  Psychiatric: She has a normal mood and affect. Judgment normal.    ED Course  Procedures (including critical care time) Labs Review Labs Reviewed  CBC WITH DIFFERENTIAL - Abnormal; Notable for the following:    WBC 18.8 (*)    Neutro Abs 14.2 (*)    All  other components within normal limits  COMPREHENSIVE METABOLIC PANEL - Abnormal; Notable for the following:    Glucose, Bld 107 (*)    Total Bilirubin <0.2 (*)    GFR calc non Af Amer 89 (*)    All other components within normal limits  LIPASE, BLOOD - Abnormal; Notable for the following:    Lipase 252 (*)    All other components within normal limits  URINALYSIS, ROUTINE W REFLEX MICROSCOPIC - Abnormal; Notable for the following:    APPearance CLOUDY (*)    Specific Gravity, Urine 1.037 (*)    Bilirubin Urine SMALL (*)    All other components within normal limits  PREGNANCY, URINE    Imaging Review US Abdomen Complete  10/05/2013   CLINICAL DATA:  Abdominal pain and emesis for 1 day.  EXAM: ULTRASOUND ABDOMEN COMPLETE  COMPARISON:  None.  FINDINGS: Gallbladder:  No gallstones or wall thickening visualized. No sonographic Murphy sign noted.  Common bile duct:  Diameter: 5 mm, normal  Liver:  No focal lesion identified. Within normal limits in parenchymal echogenicity.  IVC:  No abnormality visualized.  Pancreas:  Visualized portion unremarkable. Mostly obscured by overlying bowel gas.  Spleen:  Size and appearance within normal limits.  Right Kidney:  Length: 11 cm. Echogenicity within normal limits. No mass or hydronephrosis visualized.  Left Kidney:  Length: 10.7 cm. Echogenicity within normal limits. No mass or hydronephrosis visualized.  Abdominal aorta:  No aneurysm visualized.  Other findings:  None.  IMPRESSION: No acute abnormalities demonstrated.   Electronically Signed   By: Burman Nieves M.D.   On: 10/05/2013 04:45  I personally reviewed the imaging tests through PACS system I reviewed available ER/hospitalization records through the EMR    EKG Interpretation None      MDM   Final diagnoses:  Abdominal pain, unspecified abdominal location    Resolution of symptoms while in the emergency department.  Ultrasound without any significant abnormality.  Liver function tests  are normal.  Mild elevation in lipase. The patient is feeling better at this time.  Discharge home with GI followup.  This could represent mild pancreatitis.  With the patient feeling better at this time I do not think she needs inpatient hospitalization.  The patient will be sent home with nausea medicine, anti-inflammatories, Prilosec.  She states this feels more similar to her prior H. pylori infections and therefore she'll be started on pylera as she tolerated this in the past when prescribed through the GI office.  She understands return to the ER for new or worsening symptoms. Outpatient GI and pcp follow up  Lyanne Co, MD 10/06/13 1610  Lyanne Co, MD 10/06/13 [phone]

## 2013-10-06 NOTE — Telephone Encounter (Signed)
She was not shown to have H pylori infection by ER (even with serologies) however since that was the plan, Please call in amoxicillin 1 gram po twice daily for 14 days, clarithromycin 500 mg po twice daily for 14 days (no refills).  She needs to continue twice daily omeprazole, limit NSAID exposure.  ROV with me or extender within 5-6 weeks. Call sooner if any problems.

## 2013-10-06 NOTE — Telephone Encounter (Signed)
The prescription was sent to the pharmacy, allergies were reviewed.  She will call back to schedule follow up.

## 2013-10-17 ENCOUNTER — Ambulatory Visit: Payer: Medicaid Other | Admitting: Obstetrics & Gynecology

## 2013-10-19 ENCOUNTER — Telehealth: Payer: Self-pay | Admitting: Gastroenterology

## 2013-10-19 NOTE — Telephone Encounter (Signed)
appt with Willette Cluster for abd pain, vomiting and burning on 10/26/13 230 pm appt with Dr Christella Hartigan on 11/08/13 for follow up

## 2013-10-21 ENCOUNTER — Encounter: Payer: Self-pay | Admitting: *Deleted

## 2013-10-26 ENCOUNTER — Ambulatory Visit: Payer: Medicaid Other | Admitting: Nurse Practitioner

## 2013-10-26 ENCOUNTER — Telehealth: Payer: Self-pay | Admitting: *Deleted

## 2013-10-26 NOTE — Telephone Encounter (Signed)
I called patient to advise her she did not show for her appointment with Gunnar FusiPaula. Patient stated that she was confused and thought she only had an appointment scheduled with Dr. Christella HartiganJacobs for 11-08-2013. I advised patient to keep appointment with Dr. Christella HartiganJacobs. Patient verbalized understanding.

## 2013-11-08 ENCOUNTER — Ambulatory Visit (INDEPENDENT_AMBULATORY_CARE_PROVIDER_SITE_OTHER): Payer: Self-pay | Admitting: Gastroenterology

## 2013-11-08 ENCOUNTER — Encounter: Payer: Self-pay | Admitting: Gastroenterology

## 2013-11-08 VITALS — BP 120/70 | HR 80 | Ht 66.0 in | Wt 210.4 lb

## 2013-11-08 DIAGNOSIS — R1013 Epigastric pain: Secondary | ICD-10-CM

## 2013-11-08 DIAGNOSIS — K219 Gastro-esophageal reflux disease without esophagitis: Secondary | ICD-10-CM

## 2013-11-08 MED ORDER — ONDANSETRON 8 MG PO TBDP: 8.0000 mg | ORAL_TABLET | Freq: Three times a day (TID) | ORAL | Status: DC | PRN

## 2013-11-08 NOTE — Progress Notes (Signed)
Review of pertinent gastrointestinal problems: 1. H pylori + gastritis.  EGD Christella HartiganJacobs 03/2008 for nausea, vomiting.  Granular mucosa, biopsies + H pylori. Treated and felt better. Has intermittently been empirically retreated for H. Pylori since then when similar symptoms arise.   HPI: This is a  very pleasant 28 year old woman whom I last saw to 2-3 years ago.  Has intermittent abd pains. Has no pyrosis but does have burning in mid abd.   At times in middle of night, will have nausea, vomiting.  Has been constipated.  Takes omeprazole 20mg  once daily, just recently started.  Takes ibuprofen once every other day.  600mg .  Drinks ginger ale.  Pretty rare caffeine.    Past Medical History  Diagnosis Date  . Ovarian cyst   . Abnormal Pap smear   . Anxiety   . H. pylori infection 2008    ?reinfx - re-tx 03/2012 triple pk  . SVD (spontaneous vaginal delivery)     x 2  . Headache(784.0)     otc med prn  . Ovarian cyst     Past Surgical History  Procedure Laterality Date  . Endometrial biopsy      normal  . Cervical biopsy    . Essure tubal ligation  07/2011  . Tubal ligation    . Laparoscopy N/A 06/04/2012    Procedure: LAPAROSCOPY DIAGNOSTIC;  Surgeon: Antionette CharLisa Jackson-Moore, MD;  Location: [facility];  Service: Gynecology;  Laterality: N/A;  . Laparoscopic bilateral salpingectomy Bilateral 08/26/2013    Procedure: LAPAROSCOPIC BILATERAL SALPINGECTOMY;  Surgeon: Antionette CharLisa Jackson-Moore, MD;  Location: [facility];  Service: Gynecology;  Laterality: Bilateral;  . Esophagogastroduodenoscopy  04/12/2008    Current Outpatient Prescriptions  Medication Sig Dispense Refill  . amoxicillin (AMOXIL) 500 MG tablet Take 2 tablets (1,000 mg total) by mouth 2 (two) times daily.  56 tablet  0  . ciprofloxacin (CIPRO) 500 MG tablet Take 1 tablet (500 mg total) by mouth 2 (two) times daily.  20 tablet  0  . ibuprofen (ADVIL,MOTRIN) 800 MG tablet Take 1 tablet (800 mg total) by mouth every 8 (eight) hours as  needed.  30 tablet  5   No current facility-administered medications for this visit.    Allergies as of 11/08/2013 - Review Complete 11/08/2013  Allergen Reaction Noted  . Percocet [oxycodone-acetaminophen] Nausea And Vomiting, Swelling, and Other (See Comments) 06/17/2012    Family History  Problem Relation Age of Onset  . Dementia Paternal Grandfather   . Dementia Paternal Grandmother   . Depression Mother   . Depression Maternal Grandmother     History   Social History  . Marital Status: Married    Spouse Name: Jannet AskewMichael Lore    Number of Children: 2  . Years of Education: N/A   Occupational History  .  [employer]   Social History Main Topics  . Smoking status: Former Smoker    Types: Cigars    Quit date: 10/04/2010  . Smokeless tobacco: Never Used  . Alcohol Use: No  . Drug Use: No  . Sexual Activity: Yes    Partners: Male    Birth Control/ Protection: Surgical     Comment: Salpingectomy    Other Topics Concern  . Not on file   Social History Narrative  . No narrative on file      Physical Exam: BP 120/70  Pulse 80  Ht 5\' 6"  (1.676 m)  Wt 210 lb 6.4 oz (95.437 kg)  BMI 33.98 kg/m2  LMP 10/25/2013 Constitutional:  generally well-appearing Psychiatric: alert and oriented x3 Abdomen: soft, nontender, nondistended, no obvious ascites, no peritoneal signs, normal bowel sounds     Assessment and plan: 28 y.o. female with GERD-like dyspepsia, history of H. pylori, history of intermittent NSAID use  I recommended she change the way she is taking her antiacid medicines. Proton pump inhibitor shortly before breakfast and H2 blocker at bedtime every night. She will refrain from excessive NSAID use. I called her in antinausea medicine. She will call to report on her symptoms in 4-5 weeks and we will discuss upper endoscopy at that point if needed.

## 2013-11-08 NOTE — Patient Instructions (Addendum)
Try to avoid taking excessive NSAIDs (such as advil, motrin, alleve, etc). You should change the way you are taking your antiacid medicine (omeprazole) so that you are taking it 20-30 minutes prior to a decent meal as that is the way the pill is designed to work most effectively. Add one pepcid or zantac (OTC) take one pill at bedtime every night. Call to report your symptoms in 5-6 weeks.  If no improvement, then will discuss upper endoscopy. Zofran called in today.

## 2013-11-28 ENCOUNTER — Encounter: Payer: Self-pay | Admitting: Gastroenterology

## 2014-01-23 ENCOUNTER — Encounter: Payer: Self-pay | Admitting: *Deleted

## 2014-01-24 ENCOUNTER — Encounter: Payer: Self-pay | Admitting: Obstetrics & Gynecology

## 2014-01-24 ENCOUNTER — Ambulatory Visit: Payer: Medicaid Other | Admitting: Family

## 2014-07-25 ENCOUNTER — Encounter: Payer: Self-pay | Admitting: Gastroenterology

## 2014-09-14 ENCOUNTER — Ambulatory Visit (INDEPENDENT_AMBULATORY_CARE_PROVIDER_SITE_OTHER): Payer: Self-pay | Admitting: Internal Medicine

## 2014-09-14 ENCOUNTER — Encounter: Payer: Self-pay | Admitting: Internal Medicine

## 2014-09-14 VITALS — BP 104/64 | HR 58 | Temp 98.3°F | Ht 65.0 in | Wt 193.0 lb

## 2014-09-14 DIAGNOSIS — L259 Unspecified contact dermatitis, unspecified cause: Secondary | ICD-10-CM

## 2014-09-14 DIAGNOSIS — B359 Dermatophytosis, unspecified: Secondary | ICD-10-CM

## 2014-09-14 DIAGNOSIS — R1013 Epigastric pain: Secondary | ICD-10-CM

## 2014-09-14 MED ORDER — CLOTRIMAZOLE-BETAMETHASONE 1-0.05 % EX CREA: TOPICAL_CREAM | CUTANEOUS | Status: DC

## 2014-09-14 MED ORDER — METHYLPREDNISOLONE ACETATE 80 MG/ML IJ SUSP
80.0000 mg | Freq: Once | INTRAMUSCULAR | Status: AC
  Administered 2014-09-14: 80 mg via INTRAMUSCULAR

## 2014-09-14 MED ORDER — PANTOPRAZOLE SODIUM 40 MG PO TBEC: 40.0000 mg | DELAYED_RELEASE_TABLET | Freq: Every day | ORAL | Status: DC

## 2014-09-14 NOTE — Patient Instructions (Addendum)
You had the steroid shot today  OK to stop the omeprazole  Please take all new medication as prescribed - the protonix, and the cream, and the prednisone  (NOTE); you can try to use topical Lotrimin OTC for the ringworm if the prescription cream is too expensive  Please continue all other medications as before, and refills have been done if requested.  Please have the pharmacy call with any other refills you may need.  Please keep your appointments with your specialists as you may have planned

## 2014-09-14 NOTE — Progress Notes (Signed)
Subjective:    Patient ID: Emma Gardner, female    DOB: 1986/01/11, 29 y.o.   MRN: [id number]  HPI  Here with several issues, primary of which is itchy burning erythem nontender rash to distal LLE above the sock line, ongoing 3-4 days, not sure of any specific contact, does not spend much time outdoors, no fever, no trauma, no red streaks or swelling.  Also with mild worsening reflux and discomfort, but no abd pain, dysphagia, n/v, bowel change or blood. Omeprazole no longer working well. Has hx of h pylori, tx per GI/Dr Christella Hartigan. Also with another rash circular nontender erythema growing larger to right prox lateral thigh, without tender, swelling, scaliness or red streaks.  Past Medical History  Diagnosis Date  . Ovarian cyst   . Abnormal Pap smear   . Anxiety   . H. pylori infection 2008    ?reinfx - re-tx 03/2012 triple pk  . SVD (spontaneous vaginal delivery)     x 2  . Headache(784.0)     otc med prn  . Ovarian cyst    Past Surgical History  Procedure Laterality Date  . Endometrial biopsy      normal  . Cervical biopsy    . Essure tubal ligation  07/2011  . Tubal ligation    . Laparoscopy N/A 06/04/2012    Procedure: LAPAROSCOPY DIAGNOSTIC;  Surgeon: Antionette Char, MD;  Location: [facility];  Service: Gynecology;  Laterality: N/A;  . Laparoscopic bilateral salpingectomy Bilateral 08/26/2013    Procedure: LAPAROSCOPIC BILATERAL SALPINGECTOMY;  Surgeon: Antionette Char, MD;  Location: [facility];  Service: Gynecology;  Laterality: Bilateral;  . Esophagogastroduodenoscopy  04/12/2008    reports that she quit smoking about 3 years ago. Her smoking use included Cigars. She has never used smokeless tobacco. She reports that she does not drink alcohol or use illicit drugs. family history includes Dementia in her paternal grandfather and paternal grandmother; Depression in her maternal grandmother and mother. Allergies  Allergen Reactions  . Percocet [Oxycodone-Acetaminophen] Nausea And  Vomiting, Swelling and Other (See Comments)    Headaches onset 06/06/2012   No current outpatient prescriptions on file prior to visit.   No current facility-administered medications on file prior to visit.   Review of Systems  Constitutional: Negative for unusual diaphoresis or night sweats HENT: Negative for ringing in ear or discharge Eyes: Negative for double vision or worsening visual disturbance.  Respiratory: Negative for choking and stridor.   Gastrointestinal: Negative for vomiting or other signifcant bowel change Genitourinary: Negative for hematuria or change in urine volume.  Musculoskeletal: Negative for other MSK pain or swelling Skin: Negative for color change and worsening wound.  Neurological: Negative for tremors and numbness other than noted  Psychiatric/Behavioral: Negative for decreased concentration or agitation other than above       Objective:   Physical Exam BP 104/64 mmHg  Pulse 58  Temp(Src) 98.3 F (36.8 C) (Oral)  Ht 5\' 5"  (1.651 m)  Wt 193 lb (87.544 kg)  BMI 32.12 kg/m2  SpO2 98%  LMP 08/23/2014 VS noted, not ill appearing Constitutional: Pt appears in no significant distress HENT: Head: NCAT.  Right Ear: External ear normal.  Left Ear: External ear normal.  Eyes: . Pupils are equal, round, and reactive to light. Conjunctivae and EOM are normal Neck: Normal range of motion. Neck supple.  Cardiovascular: Normal rate and regular rhythm.   Pulmonary/Chest: Effort normal and breath sounds without rales or wheezing.  Abd:  Soft, NT, ND, +  BS Neurological: Pt is alert. Not confused , motor grossly intact Skin: Skin is warm., no LE edema, rash #1 is non tender nonswollen erythem area for 2 cm distal ant LLE only immediately starting at sock line; rash #2 is 2 cm area nontender erythema slightly scaly to right lateral thigh, no drainage Psychiatric: Pt behavior is normal. No agitation.     Assessment & Plan:

## 2014-09-14 NOTE — Progress Notes (Signed)
Pre visit review using our clinic review tool, if applicable. No additional management support is needed unless otherwise documented below in the visit note. 

## 2014-09-17 NOTE — Assessment & Plan Note (Signed)
Ok for change omeprazole to protonix 40 qd,  to f/u any worsening symptoms or concerns, consider GI f/u

## 2014-09-17 NOTE — Assessment & Plan Note (Signed)
Single area right lateral thigh, for lotrisone asd,  to f/u any worsening symptoms or concerns

## 2014-09-17 NOTE — Assessment & Plan Note (Addendum)
Mild to mod, for depomedrol im, predpac asd,  to f/u any worsening symptoms or concerns

## 2015-08-23 ENCOUNTER — Ambulatory Visit: Payer: Medicaid Other | Admitting: Obstetrics

## 2015-09-04 ENCOUNTER — Encounter: Payer: Self-pay | Admitting: Obstetrics

## 2015-09-04 ENCOUNTER — Ambulatory Visit (INDEPENDENT_AMBULATORY_CARE_PROVIDER_SITE_OTHER): Payer: Self-pay | Admitting: Obstetrics

## 2015-09-04 VITALS — BP 133/82 | HR 79 | Temp 98.7°F | Ht 65.0 in | Wt 181.6 lb

## 2015-09-04 DIAGNOSIS — Z124 Encounter for screening for malignant neoplasm of cervix: Secondary | ICD-10-CM

## 2015-09-04 DIAGNOSIS — R102 Pelvic and perineal pain: Secondary | ICD-10-CM

## 2015-09-04 MED ORDER — HYDROCODONE-IBUPROFEN 7.5-200 MG PO TABS: 1.0000 | ORAL_TABLET | Freq: Four times a day (QID) | ORAL | 0 refills | Status: DC | PRN

## 2015-09-04 NOTE — Progress Notes (Signed)
Patient ID: Emma Gardner, female   DOB: 09/10/1985, 30 y.o.   MRN: [id number]   Subjective:        Emma Gardner is a 30 y.o. female here for a pap smear.  Current complaints: RLQ pelvic pain, worse over the past 2 months.  Pain is worsened by heavy lifting at work.  Sexual intercourse is also painful with deep penetration.  Personal health questionnaire:  Is patient Ashkenazi Jewish, have a family history of breast and/or ovarian cancer: no Is there a family history of uterine cancer diagnosed at age < 5950, gastrointestinal cancer, urinary tract cancer, family member who is a Personnel officerLynch syndrome-associated carrier: no Is the patient overweight and hypertensive, family history of diabetes, personal history of gestational diabetes, preeclampsia or PCOS: no Is patient over 4055, have PCOS,  family history of premature CHD under age 31, diabetes, smoke, have hypertension or peripheral artery disease:  no At any time, has a partner hit, kicked or otherwise hurt or frightened you?: no Over the past 2 weeks, have you felt down, depressed or hopeless?: no Over the past 2 weeks, have you felt little interest or pleasure in doing things?:no   Gynecologic History Patient's last menstrual period was 08/21/2015 (exact date). Contraception: S/P Bilateral Salpingectomy after failed Essure Last Pap: ~ 2 years ago. Results were: normal Last mammogram: n/a. Results were: n/a  Obstetric History OB History  Gravida Para Term Preterm AB Living  3 3 3     3   SAB TAB Ectopic Multiple Live Births          3    # Outcome Date GA Lbr Len/2nd Weight Sex Delivery Anes PTL Lv  3 Term 06/22/13 3523w1d 12:20 / 00:23 7 lb 7.2 oz (3.379 kg) M Vag-Spont EPI  LIV     Birth Comments: WNL  2 Term 03/24/06 3447w0d  6 lb 13 oz (3.09 kg) M Vag-Spont EPI N LIV  1 Term 11/18/04 5247w0d  6 lb 7 oz (2.92 kg) F Vag-Spont EPI N LIV      Past Medical History:  Diagnosis Date  . Abnormal Pap smear   . Anxiety   . H. pylori  infection 2008   ?reinfx - re-tx 03/2012 triple pk  . Headache(784.0)    otc med prn  . Ovarian cyst   . Ovarian cyst   . SVD (spontaneous vaginal delivery)    x 2    Past Surgical History:  Procedure Laterality Date  . CERVICAL BIOPSY    . ENDOMETRIAL BIOPSY     normal  . ESOPHAGOGASTRODUODENOSCOPY  04/12/2008  . ESSURE TUBAL LIGATION  07/2011  . LAPAROSCOPIC BILATERAL SALPINGECTOMY Bilateral 08/26/2013   Procedure: LAPAROSCOPIC BILATERAL SALPINGECTOMY;  Surgeon: Antionette CharLisa Jackson-Moore, MD;  Location: [facility];  Service: Gynecology;  Laterality: Bilateral;  . LAPAROSCOPY N/A 06/04/2012   Procedure: LAPAROSCOPY DIAGNOSTIC;  Surgeon: Antionette CharLisa Jackson-Moore, MD;  Location: [facility];  Service: Gynecology;  Laterality: N/A;  . TUBAL LIGATION       Current Outpatient Prescriptions:  .  clotrimazole-betamethasone (LOTRISONE) cream, Use as directed (Patient not taking: Reported on 09/04/2015), Disp: 45 g, Rfl: 1 .  HYDROcodone-ibuprofen (VICOPROFEN) 7.5-200 MG tablet, Take 1 tablet by mouth every 6 (six) hours as needed for moderate pain., Disp: 40 tablet, Rfl: 0 .  pantoprazole (PROTONIX) 40 MG tablet, Take 1 tablet (40 mg total) by mouth daily. (Patient not taking: Reported on 09/04/2015), Disp: 90 tablet, Rfl: 3 Allergies  Allergen Reactions  . Percocet [Oxycodone-Acetaminophen] Nausea  And Vomiting, Swelling and Other (See Comments)    Headaches onset 06/06/2012    Social History  Substance Use Topics  . Smoking status: Former Smoker    Types: Cigars    Quit date: 10/04/2010  . Smokeless tobacco: Never Used  . Alcohol use No    Family History  Problem Relation Age of Onset  . Depression Mother   . Dementia Paternal Grandfather   . Dementia Paternal Grandmother   . Depression Maternal Grandmother       Review of Systems  Constitutional: negative for fatigue and weight loss Respiratory: negative for cough and wheezing Cardiovascular: negative for chest pain, fatigue and  palpitations Gastrointestinal: negative for abdominal pain and change in bowel habits Musculoskeletal:negative for myalgias Neurological: negative for gait problems and tremors Behavioral/Psych: negative for abusive relationship, depression Endocrine: negative for temperature intolerance   Genitourinary:positive for pelvic pain Integument/breast: negative for breast lump, breast tenderness, nipple discharge and skin lesion(s)    Objective:       BP 133/82   Pulse 79   Temp 98.7 F (37.1 C) (Oral)   Ht  (1.651 m)   Wt 181 lb 9.6 oz (82.4 kg)   LMP 08/21/2015 (Exact Date)   BMI 30.22 kg/m    PE:        General:  Alert and no distress      Abdomen:  Soft and nontender.  No organomegaly or masses.      Uterus:  NSSC, nontender.  No CMT.      Adnexa:  Negative for masses or tenderness  Lab Review Urine pregnancy test Labs reviewed yes Radiologic studies reviewed no  100% of 10 min visit spent on counseling and coordination of care.   Assessment:    Healthy female exam.    Chronic pelvic pain   Plan:    Education reviewed: depression evaluation, low fat, low cholesterol diet, safe sex/STD prevention and self breast exams. Follow up in: several months.  Patient plans consultation with Dr. Tamela Oddi.  Meds ordered this encounter  Medications  . HYDROcodone-ibuprofen (VICOPROFEN) 7.5-200 MG tablet    Sig: Take 1 tablet by mouth every 6 (six) hours as needed for moderate pain.    Dispense:  40 tablet    Refill:  0   No orders of the defined types were placed in this encounter.

## 2015-09-07 LAB — PAP IG (IMAGE GUIDED): PAP Smear Comment: 0

## 2016-02-29 ENCOUNTER — Encounter: Payer: Self-pay | Admitting: Gastroenterology

## 2016-02-29 ENCOUNTER — Ambulatory Visit (INDEPENDENT_AMBULATORY_CARE_PROVIDER_SITE_OTHER): Payer: Self-pay | Admitting: Gastroenterology

## 2016-02-29 VITALS — BP 104/70 | HR 84 | Ht 66.0 in | Wt 185.2 lb

## 2016-02-29 DIAGNOSIS — R112 Nausea with vomiting, unspecified: Secondary | ICD-10-CM

## 2016-02-29 DIAGNOSIS — R109 Unspecified abdominal pain: Secondary | ICD-10-CM

## 2016-02-29 NOTE — Progress Notes (Signed)
Review of pertinent gastrointestinal problems: 1. H pylori + gastritis.  EGD Christella Hartigan 03/2008 for nausea, vomiting.  Granular mucosa, biopsies + H pylori. Treated and felt better. Has intermittently been empirically retreated for H. Pylori since then when similar symptoms arise.   HPI: This is a  very pleasant 31 year old woman whom I last saw couple years ago.  Chief complaint is nausea, intermittent vomiting, epigastric discomfort  She has a history of H. pylori infection. See above.  For a few months, nausea.  She has vomiting at times.  She thinks she had stomach flue 3 weeks ago.  Feels a knot in her stomach, epigatrium. Butterfly type sensation throughout abdomen.   Weight intensionally down since birth of child 2 weeks ago.  Has BM every 2-3 days.  She does not have significant straining or pushing to move her bowels. No overt GI bleeding  NSAIDs PRN only; usually every 2-3 days (600mg  at a time).  Takes omeprazole PRN only.   ROS: complete GI ROS as described in HPI.  Constitutional:  No unintentional weight loss   Past Medical History:  Diagnosis Date  . Abnormal Pap smear   . Anxiety   . H. pylori infection 2008   ?reinfx - re-tx 03/2012 triple pk  . Headache(784.0)    otc med prn  . Ovarian cyst   . Ovarian cyst   . SVD (spontaneous vaginal delivery)    x 2    Past Surgical History:  Procedure Laterality Date  . CERVICAL BIOPSY    . ENDOMETRIAL BIOPSY     normal  . ESOPHAGOGASTRODUODENOSCOPY  04/12/2008  . ESSURE TUBAL LIGATION  07/2011  . LAPAROSCOPIC BILATERAL SALPINGECTOMY Bilateral 08/26/2013   Procedure: LAPAROSCOPIC BILATERAL SALPINGECTOMY;  Surgeon: Antionette Char, MD;  Location: [facility];  Service: Gynecology;  Laterality: Bilateral;  . LAPAROSCOPY N/A 06/04/2012   Procedure: LAPAROSCOPY DIAGNOSTIC;  Surgeon: Antionette Char, MD;  Location: [facility];  Service: Gynecology;  Laterality: N/A;  . TUBAL LIGATION      Current Outpatient Prescriptions   Medication Sig Dispense Refill  . ibuprofen (ADVIL,MOTRIN) 200 MG tablet Take 200 mg by mouth as needed.    Marland Kitchen omeprazole (PRILOSEC OTC) 20 MG tablet Take 20 mg by mouth as needed.     No current facility-administered medications for this visit.     Allergies as of 02/29/2016 - Review Complete 02/29/2016  Allergen Reaction Noted  . Percocet [oxycodone-acetaminophen] Nausea And Vomiting, Swelling, and Other (See Comments) 06/17/2012    Family History  Problem Relation Age of Onset  . Depression Mother   . Dementia Paternal Grandfather   . Dementia Paternal Grandmother   . Depression Maternal Grandmother     Social History   Social History  . Marital status: Married    Spouse name: Angelita Harnack  . Number of children: 2  . Years of education: N/A   Occupational History  .  [employer]   Social History Main Topics  . Smoking status: Former Smoker    Types: Cigars    Quit date: 10/04/2010  . Smokeless tobacco: Never Used  . Alcohol use No  . Drug use: No  . Sexual activity: Yes    Partners: Male    Birth control/ protection: Surgical     Comment: Salpingectomy    Other Topics Concern  . Not on file   Social History Narrative  . No narrative on file     Physical Exam: Ht 5\' 6"  (1.676 m)   Wt 185  lb 4 oz (84 kg)   BMI 29.90 kg/m  Constitutional: generally well-appearing Psychiatric: alert and oriented x3 Abdomen: soft, nontender, nondistended, no obvious ascites, no peritoneal signs, normal bowel sounds No peripheral edema noted in lower extremities  Assessment and plan: 31 y.o. female with Chronic nausea, intermittent epigastric pains  I recommended we proceed with upper endoscopy at her soonest convenience to check for peptic ulcer disease, significant gastritis, recurrent H. pylori infection.  Please see the "Patient Instructions" section for addition details about the plan.  Rob Buntinganiel Jacobs, MD [facility] 02/29/2016, 9:37 AM

## 2016-02-29 NOTE — Patient Instructions (Addendum)
You will be set up for an upper endoscopy for nausea, abdominal pains.

## 2016-03-31 ENCOUNTER — Encounter: Payer: Self-pay | Admitting: Gastroenterology

## 2016-03-31 ENCOUNTER — Ambulatory Visit (AMBULATORY_SURGERY_CENTER): Payer: Self-pay | Admitting: Gastroenterology

## 2016-03-31 VITALS — BP 104/67 | HR 59 | Temp 99.1°F | Resp 15 | Ht 66.0 in | Wt 185.0 lb

## 2016-03-31 DIAGNOSIS — K2970 Gastritis, unspecified, without bleeding: Secondary | ICD-10-CM

## 2016-03-31 DIAGNOSIS — K2950 Unspecified chronic gastritis without bleeding: Secondary | ICD-10-CM

## 2016-03-31 DIAGNOSIS — K2990 Gastroduodenitis, unspecified, without bleeding: Secondary | ICD-10-CM

## 2016-03-31 DIAGNOSIS — R112 Nausea with vomiting, unspecified: Secondary | ICD-10-CM

## 2016-03-31 MED ORDER — SODIUM CHLORIDE 0.9 % IV SOLN: 500.0000 mL | INTRAVENOUS | Status: DC

## 2016-03-31 NOTE — Op Note (Signed)
[facility] Patient Name: Emma Gardner Procedure Date: 03/31/2016 3:24 PM MRN: [id number] Endoscopist: Rachael Fee , MD Age: 31 Referring MD:  Date of Birth: [birth year] Gender: Female Account #: [id number] Procedure:                Upper GI endoscopy Indications:              Nausea with vomiting Medicines:                Monitored Anesthesia Care Procedure:                Pre-Anesthesia Assessment:                           - Prior to the procedure, a History and Physical                            was performed, and patient medications and                            allergies were reviewed. The patient's tolerance of                            previous anesthesia was also reviewed. The risks                            and benefits of the procedure and the sedation                            options and risks were discussed with the patient.                            All questions were answered, and informed consent                            was obtained. Prior Anticoagulants: The patient has                            taken no previous anticoagulant or antiplatelet                            agents. ASA Grade Assessment: II - A patient with                            mild systemic disease. After reviewing the risks                            and benefits, the patient was deemed in                            satisfactory condition to undergo the procedure.                           After obtaining informed consent, the endoscope was  passed under direct vision. Throughout the                            procedure, the patient's blood pressure, pulse, and                            oxygen saturations were monitored continuously. The                            Endoscope was introduced through the mouth, and                            advanced to the second part of duodenum. The upper                            GI endoscopy was accomplished  without difficulty.                            The patient tolerated the procedure well. Scope In: Scope Out: Findings:                 The esophagus was normal.                           Mild inflammation characterized by erythema was                            found in the gastric antrum. Biopsies were taken                            with a cold forceps for histology.                           The examined duodenum was normal. Complications:            No immediate complications. Estimated blood loss:                            None. Estimated Blood Loss:     Estimated blood loss: none. Impression:               - Mild gastritis, biopsied to check for H. pylori. Recommendation:           - Patient has a contact number available for                            emergencies. The signs and symptoms of potential                            delayed complications were discussed with the                            patient. Return to normal activities tomorrow.                            Written discharge instructions were provided to the  patient.                           - Resume previous diet.                           - Continue present medications. Please try to take                            omeprazole every morning (20-7130min prior to BF                            meal) and also start ranitidine 150mg  pill, one                            pill at bedtime nightly.                           - Await pathology results. Rachael Feeaniel P Jacobs, MD 03/31/2016 3:44:08 PM This report has been signed electronically.

## 2016-03-31 NOTE — Progress Notes (Signed)
Report to PACU, RN, vss, BBS= Clear.  

## 2016-03-31 NOTE — Patient Instructions (Signed)
YOU HAD AN ENDOSCOPIC PROCEDURE TODAY AT THE [facility]:   Refer to the procedure report that was given to you for any specific questions about what was found during the examination.  If the procedure report does not answer your questions, please call your gastroenterologist to clarify.  If you requested that your care partner not be given the details of your procedure findings, then the procedure report has been included in a sealed envelope for you to review at your convenience later.  YOU SHOULD EXPECT: Some feelings of bloating in the abdomen. Passage of more gas than usual.  Walking can help get rid of the air that was put into your GI tract during the procedure and reduce the bloating. If you had a lower endoscopy (such as a colonoscopy or flexible sigmoidoscopy) you may notice spotting of blood in your stool or on the toilet paper. If you underwent a bowel prep for your procedure, you may not have a normal bowel movement for a few days.  Please Note:  You might notice some irritation and congestion in your nose or some drainage.  This is from the oxygen used during your procedure.  There is no need for concern and it should clear up in a day or so.  SYMPTOMS TO REPORT IMMEDIATELY:      Following upper endoscopy (EGD)  Vomiting of blood or coffee ground material  New chest pain or pain under the shoulder blades  Painful or persistently difficult swallowing  New shortness of breath  Fever of 100F or higher  Black, tarry-looking stools  For urgent or emergent issues, a gastroenterologist can be reached at any hour by calling [phone].   DIET:  We do recommend a small meal at first, but then you may proceed to your regular diet.  Drink plenty of fluids but you should avoid alcoholic beverages for 24 hours.  ACTIVITY:  You should plan to take it easy for the rest of today and you should NOT DRIVE or use heavy machinery until tomorrow (because of the sedation medicines  used during the test).    FOLLOW UP: Our staff will call the number listed on your records the next business day following your procedure to check on you and address any questions or concerns that you may have regarding the information given to you following your procedure. If we do not reach you, we will leave a message.  However, if you are feeling well and you are not experiencing any problems, there is no need to return our call.  We will assume that you have returned to your regular daily activities without incident.  If any biopsies were taken you will be contacted by phone or by letter within the next 1-3 weeks.  Please call us at [phone] if you have not heard about the biopsies in 3 weeks.   Gastritis (handout given) Await for biopsy results Please try Omeprazole every morning (20-30 minutes prior to breakfast meal) and also start Ranitidine 150 mg pill at bedtime nightly    SIGNATURES/CONFIDENTIALITY: You and/or your care partner have signed paperwork which will be entered into your electronic medical record.  These signatures attest to the fact that that the information above on your After Visit Summary has been reviewed and is understood.  Full responsibility of the confidentiality of this discharge information lies with you and/or your care-partner.

## 2016-03-31 NOTE — Progress Notes (Signed)
Called to room to assist during endoscopic procedure.  Patient ID and intended procedure confirmed with present staff. Received instructions for my participation in the procedure from the performing physician.  

## 2016-04-01 ENCOUNTER — Telehealth: Payer: Self-pay | Admitting: *Deleted

## 2016-04-01 NOTE — Telephone Encounter (Signed)
  Follow up Call-  Call back number 03/31/2016  Post procedure Call Back phone  # [phone]  Some recent data might be hidden     Patient questions:  Do you have a fever, pain , or abdominal swelling? No. Pain Score  0 *  Have you tolerated food without any problems? Yes.    Have you been able to return to your normal activities? Yes.    Do you have any questions about your discharge instructions: Diet   No. Medications  No. Follow up visit  No.  Do you have questions or concerns about your Care? No.  Actions: * If pain score is 4 or above: No action needed, pain <4.

## 2016-04-08 ENCOUNTER — Encounter: Payer: Self-pay | Admitting: Gastroenterology

## 2017-05-21 ENCOUNTER — Encounter: Payer: Self-pay | Admitting: Internal Medicine

## 2017-05-21 DIAGNOSIS — Z2089 Contact with and (suspected) exposure to other communicable diseases: Secondary | ICD-10-CM

## 2017-09-24 NOTE — Patient Instructions (Signed)
Emma HallerOmniya S Runco  09/24/2017      Your procedure is scheduled on 10/01/2017   [facility] [place]  at    0600 A.M.  Call this number if you have problems the morning of [phone]  OUR ADDRESS IS [address], WE ARE LOCATED IN THE MEDICAL PLA[facility] WITH [facility].   Remember:  Do not eat food or drink liquids after midnight.  Take these medicines the morning of surgery with A SIP OF WATER: none    Do not wear jewelry, make-up or nail polish.  Do not wear lotions, powders, or perfumes, or deoderant.  Do not shave 48 hours prior to surgery.  Men may shave face and neck.  Do not bring valuables to the hospital.  [facility] not responsible for any belongings or valuables.  Contacts, dentures or bridgework may not be worn into surgery.  Leave your suitcase in the car.  After surgery it may be brought to your room.  For patients admitted to the hospital, discharge time will be determined by your treatment team.  Patients discharged the day of surgery will not be allowed to drive home.   Special instructions:  Coughing and deep breathing exercises, leg exercises   Please read over the following fact sheets that you were given:       [facility] - Preparing for Surgery Before surgery, you can play an important role.  Because skin is not sterile, your skin needs to be as free of germs as possible.  You can reduce the number of germs on your skin by washing with CHG (chlorahexidine gluconate) soap before surgery.  CHG is an antiseptic cleaner which kills germs and bonds with the skin to continue killing germs even after washing. Please DO NOT use if you have an allergy to CHG or antibacterial soaps.  If your skin becomes reddened/irritated stop using the CHG and inform your nurse when you arrive at Short Stay. Do not shave (including legs and underarms) for at least 48 hours prior to the first CHG shower.  You may shave your face/neck. Please  follow these instructions carefully:  1.  Shower with CHG Soap the night before surgery and the  morning of Surgery.  2.  If you choose to wash your hair, wash your hair first as usual with your  normal  shampoo.  3.  After you shampoo, rinse your hair and body thoroughly to remove the  shampoo.                           4.  Use CHG as you would any other liquid soap.  You can apply chg directly  to the skin and wash                       Gently with a scrungie or clean washcloth.  5.  Apply the CHG Soap to your body ONLY FROM THE NECK DOWN.   Do not use on face/ open                           Wound or open sores. Avoid contact with eyes, ears mouth and genitals (private parts).                       Wash face,  Genitals (private parts) with your normal soap.  6.  Wash thoroughly, paying special attention to the area where your surgery  will be performed.  7.  Thoroughly rinse your body with warm water from the neck down.  8.  DO NOT shower/wash with your normal soap after using and rinsing off  the CHG Soap.                9.  Pat yourself dry with a clean towel.            10.  Wear clean pajamas.            11.  Place clean sheets on your bed the night of your first shower and do not  sleep with pets. Day of Surgery : Do not apply any lotions/deodorants the morning of surgery.  Please wear clean clothes to the hospital/surgery center.  FAILURE TO FOLLOW THESE INSTRUCTIONS MAY RESULT IN THE CANCELLATION OF YOUR SURGERY PATIENT SIGNATURE_________________________________  NURSE SIGNATURE__________________________________  ________________________________________________________________________   Adam Phenix  An incentive spirometer is a tool that can help keep your lungs clear and active. This tool measures how well you are filling your lungs with each breath. Taking long deep breaths may help reverse or decrease the chance of developing breathing (pulmonary) problems  (especially infection) following:  A long period of time when you are unable to move or be active. BEFORE THE PROCEDURE   If the spirometer includes an indicator to show your best effort, your nurse or respiratory therapist will set it to a desired goal.  If possible, sit up straight or lean slightly forward. Try not to slouch.  Hold the incentive spirometer in an upright position. INSTRUCTIONS FOR USE  1. Sit on the edge of your bed if possible, or sit up as far as you can in bed or on a chair. 2. Hold the incentive spirometer in an upright position. 3. Breathe out normally. 4. Place the mouthpiece in your mouth and seal your lips tightly around it. 5. Breathe in slowly and as deeply as possible, raising the piston or the ball toward the top of the column. 6. Hold your breath for 3-5 seconds or for as long as possible. Allow the piston or ball to fall to the bottom of the column. 7. Remove the mouthpiece from your mouth and breathe out normally. 8. Rest for a few seconds and repeat Steps 1 through 7 at least 10 times every 1-2 hours when you are awake. Take your time and take a few normal breaths between deep breaths. 9. The spirometer may include an indicator to show your best effort. Use the indicator as a goal to work toward during each repetition. 10. After each set of 10 deep breaths, practice coughing to be sure your lungs are clear. If you have an incision (the cut made at the time of surgery), support your incision when coughing by placing a pillow or rolled up towels firmly against it. Once you are able to get out of bed, walk around indoors and cough well. You may stop using the incentive spirometer when instructed by your caregiver.  RISKS AND COMPLICATIONS  Take your time so you do not get dizzy or light-headed.  If you are in pain, you may need to take or ask for pain medication before doing incentive spirometry. It is harder to take a deep breath if you are having  pain. AFTER USE  Rest and breathe slowly and easily.  It can be helpful to keep track of a log of  your progress. Your caregiver can provide you with a simple table to help with this. If you are using the spirometer at home, follow these instructions: [place] IF:   You are having difficultly using the spirometer.  You have trouble using the spirometer as often as instructed.  Your pain medication is not giving enough relief while using the spirometer.  You develop fever of 100.5 F (38.1 C) or higher. SEEK IMMEDIATE MEDICAL CARE IF:   You cough up bloody sputum that had not been present before.  You develop fever of 102 F (38.9 C) or greater.  You develop worsening pain at or near the incision site. MAKE SURE YOU:   Understand these instructions.  Will watch your condition.  Will get help right away if you are not doing well or get worse. Document Released: 05/26/2006 Document Revised: 04/07/2011 Document Reviewed: 07/27/2006 [facility] Patient Information 2014 [place], [place].   ________________________________________________________________________

## 2017-09-25 ENCOUNTER — Other Ambulatory Visit: Payer: Self-pay

## 2017-09-25 ENCOUNTER — Encounter (HOSPITAL_COMMUNITY): Payer: Self-pay

## 2017-09-25 ENCOUNTER — Encounter (HOSPITAL_COMMUNITY)
Admission: RE | Admit: 2017-09-25 | Discharge: 2017-09-25 | Disposition: A | Discharge status: Home / Self Care | Payer: Self-pay | Source: Ambulatory Visit | Attending: Obstetrics and Gynecology | Admitting: Obstetrics and Gynecology

## 2017-09-25 ENCOUNTER — Encounter (INDEPENDENT_AMBULATORY_CARE_PROVIDER_SITE_OTHER): Payer: Self-pay

## 2017-09-25 DIAGNOSIS — Z01812 Encounter for preprocedural laboratory examination: Secondary | ICD-10-CM | POA: Insufficient documentation

## 2017-09-25 DIAGNOSIS — Z0183 Encounter for blood typing: Secondary | ICD-10-CM | POA: Insufficient documentation

## 2017-09-25 DIAGNOSIS — N946 Dysmenorrhea, unspecified: Secondary | ICD-10-CM | POA: Insufficient documentation

## 2017-09-25 DIAGNOSIS — R102 Pelvic and perineal pain: Secondary | ICD-10-CM | POA: Insufficient documentation

## 2017-09-25 HISTORY — DX: Gastro-esophageal reflux disease without esophagitis: K21.9

## 2017-09-25 LAB — BASIC METABOLIC PANEL
Anion gap: 9 (ref 5–15)
BUN: 11 mg/dL (ref 6–20)
CALCIUM: 9.1 mg/dL (ref 8.9–10.3)
CO2: 28 mmol/L (ref 22–32)
CREATININE: 0.84 mg/dL (ref 0.44–1.00)
Chloride: 102 mmol/L (ref 98–111)
GFR calc non Af Amer: >60 mL/min (ref >60)
GLUCOSE: 75 mg/dL (ref 70–99)
Potassium: 3.4 mmol/L — ABNORMAL LOW (ref 3.5–5.1)
Sodium: 139 mmol/L (ref 135–145)

## 2017-09-25 LAB — CBC
HCT: 36.3 % (ref 36.0–46.0)
Hemoglobin: 12.2 g/dL (ref 12.0–15.0)
MCH: 31.7 pg (ref 26.0–34.0)
MCHC: 33.6 g/dL (ref 30.0–36.0)
MCV: 94.3 fL (ref 78.0–100.0)
Platelets: 372 10*3/uL (ref 150–400)
RBC: 3.85 MIL/uL — ABNORMAL LOW (ref 3.87–5.11)
RDW: 13.4 % (ref 11.5–15.5)
WBC: 7 10*3/uL (ref 4.0–10.5)

## 2017-09-25 LAB — PREGNANCY, URINE: Preg Test, Ur: NEGATIVE

## 2017-09-25 LAB — ABO/RH: ABO/RH(D): A POS

## 2017-09-30 NOTE — Anesthesia Preprocedure Evaluation (Addendum)
Anesthesia Evaluation  Patient identified by MRN, date of birth, ID band Patient awake    Reviewed: Allergy & Precautions, NPO status , Patient's Chart, lab work & pertinent test results  Airway Mallampati: II  TM Distance: >3 FB Neck ROM: Full    Dental no notable dental hx.    Pulmonary former smoker,    Pulmonary exam normal breath sounds clear to auscultation       Cardiovascular negative cardio ROS Normal cardiovascular exam Rhythm:Regular Rate:Normal     Neuro/Psych  Headaches, Anxiety    GI/Hepatic Neg liver ROS, GERD  Controlled,  Endo/Other  negative endocrine ROS  Renal/GU negative Renal ROS     Musculoskeletal negative musculoskeletal ROS (+)   Abdominal   Peds  Hematology negative hematology ROS (+)   Anesthesia Other Findings Pain in pelvis  Reproductive/Obstetrics S/p BTL hcg negative                           Anesthesia Physical Anesthesia Plan  ASA: II  Anesthesia Plan: General   Post-op Pain Management:    Induction: Intravenous  PONV Risk Score and Plan: 4 or greater and Scopolamine patch - Pre-op, Midazolam, Dexamethasone, Ondansetron and Treatment may vary due to age or medical condition  Airway Management Planned: Oral ETT  Additional Equipment:   Intra-op Plan:   Post-operative Plan: Extubation in OR  Informed Consent: I have reviewed the patients History and Physical, chart, labs and discussed the procedure including the risks, benefits and alternatives for the proposed anesthesia with the patient or authorized representative who has indicated his/her understanding and acceptance.   Dental advisory given  Plan Discussed with: CRNA  Anesthesia Plan Comments:        Anesthesia Quick Evaluation

## 2017-09-30 NOTE — H&P (Signed)
Emma Gardner is an 32 y.o. female  G3P3 who presents for scheduled laparoscopic assisted hysterectomy/removal of any remaining fallopian tube segments, and ablation of any noted endometriosis.   She reports h/o pelvic pain for years after her delivery of second child. Had dx laparoscopy in 2014 with Dr. Tamela Oddi and no endometriosis noted, but conceived after that and had tubes removed postpartum with laparoscopy. Pain has become worse with sex, to point of not enjoying it at all for last 2 years. She states recently pain has been bilateral and sharp pain comes randomly about 2 times weekly.  Menses are q month, not heavy and does not effect pain too much. Has had EGD and GI w/u iin last 2 years that was negative. No associated N/V  Pertinent Gynecological History: Pelvic pain  OB History: NSVD x 3   Menstrual History:  Patient's last menstrual period was 09/10/2017.    Past Medical History:  Diagnosis Date  . Abnormal Pap smear   . Anxiety   . GERD (gastroesophageal reflux disease)   . H. pylori infection 2008   ?reinfx - re-tx 03/2012 triple pk  . Headache(784.0)    otc med prn  . Ovarian cyst   . Ovarian cyst   . SVD (spontaneous vaginal delivery)    x 2    Past Surgical History:  Procedure Laterality Date  . CERVICAL BIOPSY    . ENDOMETRIAL BIOPSY     normal  . ESOPHAGOGASTRODUODENOSCOPY  04/12/2008  . ESSURE TUBAL LIGATION  07/2011  . LAPAROSCOPIC BILATERAL SALPINGECTOMY Bilateral 08/26/2013   Procedure: LAPAROSCOPIC BILATERAL SALPINGECTOMY;  Surgeon: Antionette Char, MD;  Location: [facility];  Service: Gynecology;  Laterality: Bilateral;  . LAPAROSCOPY N/A 06/04/2012   Procedure: LAPAROSCOPY DIAGNOSTIC;  Surgeon: Antionette Char, MD;  Location: [facility];  Service: Gynecology;  Laterality: N/A;  . TUBAL LIGATION      Family History  Problem Relation Age of Onset  . Depression Mother   . Dementia Paternal Grandfather   . Dementia Paternal Grandmother   .  Depression Maternal Grandmother     Social History:  reports that she quit smoking about 6 years ago. Her smoking use included cigars. She has never used smokeless tobacco. She reports that she does not drink alcohol or use drugs.  Allergies:  Allergies  Allergen Reactions  . Percocet [Oxycodone-Acetaminophen] Nausea And Vomiting, Swelling and Other (See Comments)    Headaches onset 06/06/2012    No medications prior to admission.    Review of Systems  Cardiovascular: Negative for chest pain.  Gastrointestinal: Positive for abdominal pain. Negative for nausea.    Last menstrual period 09/10/2017. Physical Exam  Constitutional: She appears well-developed.  Cardiovascular: Normal rate and regular rhythm.  Respiratory: Effort normal.  GI: Soft.  Genitourinary: Vagina normal and uterus normal.  Neurological: She is alert.  Psychiatric: She has a normal mood and affect.    No results found for this or any previous visit (from the past 24 hour(s)).  No results found.  Assessment/Plan: The patient was counseled regarding the risks of laparoscopic assisted vaginal hysterectomy, The procedure was reviewed in detail and expectations regarding recovery. Risks of bleeding, infection and possible damage to bowel and bladder were reviewed. The patient understands that should a complication arise she would likely need a larger abdominal incision and that this would delay her recovery. She would accept a blood transfusion if needed. She has had a previous removal of the fallopian tubes, but if any small portion  remains, we will remove them with the uterus, Since her pain is not consistently on the left anymore, she has reconsidered her removal of ovaries. She will retain her ovaries unless there is obvious pathology the would warrant removal, and we will ablate any endometriosis seen. She is ready to proceed.   Oliver Pila 09/30/2017, 3:25 PM

## 2017-10-01 ENCOUNTER — Ambulatory Visit (HOSPITAL_BASED_OUTPATIENT_CLINIC_OR_DEPARTMENT_OTHER)
Admission: RE | Admit: 2017-10-01 | Discharge: 2017-10-02 | Disposition: A | Discharge status: Home / Self Care | Payer: Self-pay | Source: Ambulatory Visit | Attending: Obstetrics and Gynecology | Admitting: Obstetrics and Gynecology

## 2017-10-01 ENCOUNTER — Encounter (HOSPITAL_BASED_OUTPATIENT_CLINIC_OR_DEPARTMENT_OTHER)
Admission: RE | Disposition: A | Discharge status: Home / Self Care | Payer: Self-pay | Source: Ambulatory Visit | Attending: Obstetrics and Gynecology

## 2017-10-01 ENCOUNTER — Encounter (HOSPITAL_BASED_OUTPATIENT_CLINIC_OR_DEPARTMENT_OTHER): Payer: Self-pay | Admitting: *Deleted

## 2017-10-01 ENCOUNTER — Ambulatory Visit (HOSPITAL_BASED_OUTPATIENT_CLINIC_OR_DEPARTMENT_OTHER): Payer: Self-pay | Admitting: Anesthesiology

## 2017-10-01 ENCOUNTER — Other Ambulatory Visit: Payer: Self-pay

## 2017-10-01 DIAGNOSIS — N941 Unspecified dyspareunia: Secondary | ICD-10-CM | POA: Insufficient documentation

## 2017-10-01 DIAGNOSIS — N803 Endometriosis of pelvic peritoneum: Secondary | ICD-10-CM | POA: Insufficient documentation

## 2017-10-01 DIAGNOSIS — Z87891 Personal history of nicotine dependence: Secondary | ICD-10-CM | POA: Insufficient documentation

## 2017-10-01 DIAGNOSIS — F419 Anxiety disorder, unspecified: Secondary | ICD-10-CM | POA: Insufficient documentation

## 2017-10-01 DIAGNOSIS — Z90710 Acquired absence of both cervix and uterus: Secondary | ICD-10-CM | POA: Diagnosis present

## 2017-10-01 DIAGNOSIS — K219 Gastro-esophageal reflux disease without esophagitis: Secondary | ICD-10-CM | POA: Insufficient documentation

## 2017-10-01 DIAGNOSIS — R102 Pelvic and perineal pain: Secondary | ICD-10-CM | POA: Insufficient documentation

## 2017-10-01 HISTORY — PX: CYSTOSCOPY: SHX5120

## 2017-10-01 HISTORY — PX: LAPAROSCOPIC ASSISTED VAGINAL HYSTERECTOMY: SHX5398

## 2017-10-01 LAB — TYPE AND SCREEN
ABO/RH(D): A POS
ANTIBODY SCREEN: NEGATIVE

## 2017-10-01 SURGERY — HYSTERECTOMY, VAGINAL, LAPAROSCOPY-ASSISTED
Anesthesia: General | Site: Bladder

## 2017-10-01 MED ORDER — MAGNESIUM HYDROXIDE 400 MG/5ML PO SUSP
30.0000 mL | Freq: Every day | ORAL | Status: DC | PRN
  Filled 2017-10-01: qty 30

## 2017-10-01 MED ORDER — HYDROMORPHONE HCL 1 MG/ML IJ SOLN
INTRAMUSCULAR | Status: AC
  Filled 2017-10-01: qty 1

## 2017-10-01 MED ORDER — ROCURONIUM BROMIDE 10 MG/ML (PF) SYRINGE
PREFILLED_SYRINGE | INTRAVENOUS | Status: AC
  Filled 2017-10-01: qty 10

## 2017-10-01 MED ORDER — FENTANYL CITRATE (PF) 250 MCG/5ML IJ SOLN
INTRAMUSCULAR | Status: AC
  Filled 2017-10-01: qty 5

## 2017-10-01 MED ORDER — MENTHOL 3 MG MT LOZG
1.0000 | LOZENGE | OROMUCOSAL | Status: DC | PRN
  Filled 2017-10-01: qty 9

## 2017-10-01 MED ORDER — IBUPROFEN 600 MG PO TABS
600.0000 mg | ORAL_TABLET | Freq: Four times a day (QID) | ORAL | Status: DC | PRN
  Administered 2017-10-02: 600 mg via ORAL
  Filled 2017-10-01: qty 1

## 2017-10-01 MED ORDER — ONDANSETRON HCL 4 MG/2ML IJ SOLN
INTRAMUSCULAR | Status: AC
  Filled 2017-10-01: qty 2

## 2017-10-01 MED ORDER — SIMETHICONE 80 MG PO CHEW
80.0000 mg | CHEWABLE_TABLET | Freq: Four times a day (QID) | ORAL | Status: DC | PRN
  Filled 2017-10-01: qty 1

## 2017-10-01 MED ORDER — ACETAMINOPHEN 500 MG PO TABS
1000.0000 mg | ORAL_TABLET | Freq: Once | ORAL | Status: AC
  Administered 2017-10-01: 1000 mg via ORAL
  Filled 2017-10-01: qty 2

## 2017-10-01 MED ORDER — LIDOCAINE 2% (20 MG/ML) 5 ML SYRINGE
INTRAMUSCULAR | Status: DC | PRN
  Administered 2017-10-01: 100 mg via INTRAVENOUS

## 2017-10-01 MED ORDER — ROCURONIUM BROMIDE 10 MG/ML (PF) SYRINGE
PREFILLED_SYRINGE | INTRAVENOUS | Status: DC | PRN
  Administered 2017-10-01: 10 mg via INTRAVENOUS
  Administered 2017-10-01: 50 mg via INTRAVENOUS
  Administered 2017-10-01: 10 mg via INTRAVENOUS

## 2017-10-01 MED ORDER — FAMOTIDINE 20 MG PO TABS
20.0000 mg | ORAL_TABLET | Freq: Once | ORAL | Status: AC
  Administered 2017-10-01: 20 mg via ORAL
  Filled 2017-10-01: qty 1

## 2017-10-01 MED ORDER — GLYCOPYRROLATE PF 0.2 MG/ML IJ SOSY
PREFILLED_SYRINGE | INTRAMUSCULAR | Status: AC
  Filled 2017-10-01: qty 1

## 2017-10-01 MED ORDER — ACETAMINOPHEN 325 MG PO TABS
650.0000 mg | ORAL_TABLET | Freq: Four times a day (QID) | ORAL | Status: DC | PRN
  Filled 2017-10-01: qty 2

## 2017-10-01 MED ORDER — SCOPOLAMINE 1 MG/3DAYS TD PT72
1.0000 | MEDICATED_PATCH | Freq: Once | TRANSDERMAL | Status: DC
  Administered 2017-10-01: 1.5 mg via TRANSDERMAL
  Filled 2017-10-01: qty 1

## 2017-10-01 MED ORDER — GLYCOPYRROLATE PF 0.2 MG/ML IJ SOSY
PREFILLED_SYRINGE | INTRAMUSCULAR | Status: DC | PRN
  Administered 2017-10-01: .2 mg via INTRAVENOUS

## 2017-10-01 MED ORDER — FENTANYL CITRATE (PF) 100 MCG/2ML IJ SOLN
INTRAMUSCULAR | Status: DC | PRN
  Administered 2017-10-01 (×5): 50 ug via INTRAVENOUS

## 2017-10-01 MED ORDER — MIDAZOLAM HCL 2 MG/2ML IJ SOLN
INTRAMUSCULAR | Status: DC | PRN
  Administered 2017-10-01: 2 mg via INTRAVENOUS

## 2017-10-01 MED ORDER — PROPOFOL 10 MG/ML IV BOLUS
INTRAVENOUS | Status: AC
  Filled 2017-10-01: qty 40

## 2017-10-01 MED ORDER — ONDANSETRON HCL 4 MG/2ML IJ SOLN
4.0000 mg | Freq: Four times a day (QID) | INTRAMUSCULAR | Status: DC | PRN
  Administered 2017-10-01: 4 mg via INTRAVENOUS
  Filled 2017-10-01: qty 2

## 2017-10-01 MED ORDER — CEFAZOLIN SODIUM-DEXTROSE 2-4 GM/100ML-% IV SOLN
2.0000 g | INTRAVENOUS | Status: AC
  Administered 2017-10-01: 2 g via INTRAVENOUS
  Filled 2017-10-01: qty 100

## 2017-10-01 MED ORDER — DEXAMETHASONE SODIUM PHOSPHATE 10 MG/ML IJ SOLN
INTRAMUSCULAR | Status: DC | PRN
  Administered 2017-10-01: 10 mg via INTRAVENOUS

## 2017-10-01 MED ORDER — PROPOFOL 10 MG/ML IV BOLUS
INTRAVENOUS | Status: DC | PRN
  Administered 2017-10-01: 200 mg via INTRAVENOUS

## 2017-10-01 MED ORDER — ACETAMINOPHEN 160 MG/5ML PO SOLN
960.0000 mg | Freq: Once | ORAL | Status: AC
  Filled 2017-10-01: qty 40.6

## 2017-10-01 MED ORDER — KETOROLAC TROMETHAMINE 30 MG/ML IJ SOLN
30.0000 mg | Freq: Four times a day (QID) | INTRAMUSCULAR | Status: DC
  Administered 2017-10-01 – 2017-10-02 (×3): 30 mg via INTRAVENOUS
  Filled 2017-10-01: qty 1

## 2017-10-01 MED ORDER — LACTATED RINGERS IV BOLUS
500.0000 mL | Freq: Once | INTRAVENOUS | Status: AC
  Administered 2017-10-01: 500 mL via INTRAVENOUS
  Filled 2017-10-01: qty 500

## 2017-10-01 MED ORDER — CEFAZOLIN SODIUM-DEXTROSE 2-4 GM/100ML-% IV SOLN
INTRAVENOUS | Status: AC
  Filled 2017-10-01: qty 100

## 2017-10-01 MED ORDER — BUPIVACAINE HCL (PF) 0.25 % IJ SOLN
INTRAMUSCULAR | Status: DC | PRN
  Administered 2017-10-01: 10 mL

## 2017-10-01 MED ORDER — CELECOXIB 200 MG PO CAPS
ORAL_CAPSULE | ORAL | Status: AC
  Filled 2017-10-01: qty 1

## 2017-10-01 MED ORDER — STERILE WATER FOR IRRIGATION IR SOLN
Status: DC | PRN
  Administered 2017-10-01: 3000 mL

## 2017-10-01 MED ORDER — LACTATED RINGERS IV SOLN
INTRAVENOUS | Status: DC
  Administered 2017-10-01 (×2): via INTRAVENOUS
  Filled 2017-10-01: qty 1000

## 2017-10-01 MED ORDER — KETOROLAC TROMETHAMINE 30 MG/ML IJ SOLN
INTRAMUSCULAR | Status: AC
  Filled 2017-10-01: qty 1

## 2017-10-01 MED ORDER — LACTATED RINGERS IV SOLN
INTRAVENOUS | Status: DC
  Administered 2017-10-01 – 2017-10-02 (×2): via INTRAVENOUS
  Filled 2017-10-01 (×2): qty 1000

## 2017-10-01 MED ORDER — MIDAZOLAM HCL 2 MG/2ML IJ SOLN
INTRAMUSCULAR | Status: AC
  Filled 2017-10-01: qty 2

## 2017-10-01 MED ORDER — HYDROMORPHONE HCL 1 MG/ML IJ SOLN
0.5000 mg | INTRAMUSCULAR | Status: AC | PRN
  Administered 2017-10-01 (×2): 0.5 mg via INTRAVENOUS
  Filled 2017-10-01: qty 0.5

## 2017-10-01 MED ORDER — ONDANSETRON HCL 4 MG/2ML IJ SOLN
INTRAMUSCULAR | Status: DC | PRN
  Administered 2017-10-01: 4 mg via INTRAVENOUS

## 2017-10-01 MED ORDER — HYDROCODONE-ACETAMINOPHEN 5-325 MG PO TABS
2.0000 | ORAL_TABLET | Freq: Four times a day (QID) | ORAL | Status: DC | PRN
  Administered 2017-10-01 – 2017-10-02 (×3): 2 via ORAL
  Filled 2017-10-01: qty 2

## 2017-10-01 MED ORDER — SCOPOLAMINE 1 MG/3DAYS TD PT72
MEDICATED_PATCH | TRANSDERMAL | Status: AC
  Filled 2017-10-01: qty 1

## 2017-10-01 MED ORDER — SUGAMMADEX SODIUM 200 MG/2ML IV SOLN
INTRAVENOUS | Status: DC | PRN
  Administered 2017-10-01: 200 mg via INTRAVENOUS

## 2017-10-01 MED ORDER — LACTATED RINGERS IV SOLN
INTRAVENOUS | Status: DC
  Filled 2017-10-01: qty 1000

## 2017-10-01 MED ORDER — HYDROMORPHONE HCL 1 MG/ML IJ SOLN
0.2500 mg | INTRAMUSCULAR | Status: DC | PRN
  Administered 2017-10-01 (×4): 0.5 mg via INTRAVENOUS
  Filled 2017-10-01: qty 0.5

## 2017-10-01 MED ORDER — PROMETHAZINE HCL 25 MG/ML IJ SOLN
6.2500 mg | INTRAMUSCULAR | Status: DC | PRN
  Filled 2017-10-01: qty 1

## 2017-10-01 MED ORDER — SUGAMMADEX SODIUM 200 MG/2ML IV SOLN
INTRAVENOUS | Status: AC
  Filled 2017-10-01: qty 2

## 2017-10-01 MED ORDER — SODIUM CHLORIDE 0.9 % IR SOLN
Status: DC | PRN
  Administered 2017-10-01: 500 mL

## 2017-10-01 MED ORDER — HYDROMORPHONE HCL 1 MG/ML IJ SOLN
0.2000 mg | INTRAMUSCULAR | Status: DC | PRN
  Administered 2017-10-01 (×2): 0.5 mg via INTRAVENOUS
  Filled 2017-10-01: qty 1

## 2017-10-01 MED ORDER — ACETAMINOPHEN 500 MG PO TABS
ORAL_TABLET | ORAL | Status: AC
  Filled 2017-10-01: qty 2

## 2017-10-01 MED ORDER — ARTIFICIAL TEARS OPHTHALMIC OINT
TOPICAL_OINTMENT | OPHTHALMIC | Status: AC
  Filled 2017-10-01: qty 3.5

## 2017-10-01 MED ORDER — FAMOTIDINE 20 MG PO TABS
ORAL_TABLET | ORAL | Status: AC
  Filled 2017-10-01: qty 1

## 2017-10-01 MED ORDER — CELECOXIB 200 MG PO CAPS
200.0000 mg | ORAL_CAPSULE | Freq: Once | ORAL | Status: AC | PRN
  Administered 2017-10-01: 200 mg via ORAL
  Filled 2017-10-01: qty 1

## 2017-10-01 MED ORDER — VASOPRESSIN 20 UNIT/ML IV SOLN
INTRAVENOUS | Status: DC | PRN
  Administered 2017-10-01: 08:00:00 via INTRAMUSCULAR

## 2017-10-01 MED ORDER — HYDROCODONE-ACETAMINOPHEN 5-325 MG PO TABS
ORAL_TABLET | ORAL | Status: AC
  Filled 2017-10-01: qty 2

## 2017-10-01 MED ORDER — ONDANSETRON HCL 4 MG PO TABS
4.0000 mg | ORAL_TABLET | Freq: Four times a day (QID) | ORAL | Status: DC | PRN
  Filled 2017-10-01: qty 1

## 2017-10-01 MED ORDER — DEXAMETHASONE SODIUM PHOSPHATE 10 MG/ML IJ SOLN
INTRAMUSCULAR | Status: AC
  Filled 2017-10-01: qty 1

## 2017-10-01 SURGICAL SUPPLY — 56 items
ADH SKN CLS APL DERMABOND .7 (GAUZE/BANDAGES/DRESSINGS) ×4
BAG RETRIEVAL 10 (BASKET)
BAG RETRIEVAL 10MM (BASKET)
CABLE HIGH FREQUENCY MONO STRZ (ELECTRODE) ×4 IMPLANT
CATH ROBINSON RED A/P 16FR (CATHETERS) ×6 IMPLANT
CONT PATH 16OZ SNAP LID 3702 (MISCELLANEOUS) ×6 IMPLANT
COVER BACK TABLE 60X90IN (DRAPES) ×6 IMPLANT
COVER MAYO STAND STRL (DRAPES) ×6 IMPLANT
DERMABOND ADVANCED (GAUZE/BANDAGES/DRESSINGS) ×2
DERMABOND ADVANCED .7 DNX12 (GAUZE/BANDAGES/DRESSINGS) ×4 IMPLANT
DRSG COVADERM PLUS 2X2 (GAUZE/BANDAGES/DRESSINGS) IMPLANT
DRSG OPSITE POSTOP 3X4 (GAUZE/BANDAGES/DRESSINGS) ×6 IMPLANT
DURAPREP 26ML APPLICATOR (WOUND CARE) ×6 IMPLANT
ELECT REM PT RETURN 9FT ADLT (ELECTROSURGICAL)
ELECTRODE REM PT RTRN 9FT ADLT (ELECTROSURGICAL) IMPLANT
FILTER SMOKE EVAC LAPAROSHD (FILTER) IMPLANT
GLOVE BIO SURGEON STRL SZ 6.5 (GLOVE) ×18 IMPLANT
GLOVE BIO SURGEON STRL SZ7 (GLOVE) ×12 IMPLANT
GLOVE BIO SURGEONS STRL SZ 6.5 (GLOVE) ×4
GLOVE BIOGEL PI IND STRL 6.5 (GLOVE) ×4 IMPLANT
GLOVE BIOGEL PI IND STRL 7.0 (GLOVE) ×8 IMPLANT
GLOVE BIOGEL PI INDICATOR 6.5 (GLOVE) ×2
GLOVE BIOGEL PI INDICATOR 7.0 (GLOVE) ×4
GOWN STRL REUS W/TWL LRG LVL3 (GOWN DISPOSABLE) ×10 IMPLANT
LEGGING LITHOTOMY PAIR STRL (DRAPES) ×6 IMPLANT
NEEDLE INSUFFLATION 120MM (ENDOMECHANICALS) ×6 IMPLANT
NS IRRIG 1000ML POUR BTL (IV SOLUTION) ×2 IMPLANT
NS IRRIG 500ML POUR BTL (IV SOLUTION) ×10 IMPLANT
PACK LAPAROSCOPY BASIN (CUSTOM PROCEDURE TRAY) ×6 IMPLANT
PACK LAVH (CUSTOM PROCEDURE TRAY) ×6 IMPLANT
PACK ROBOTIC GOWN (GOWN DISPOSABLE) ×6 IMPLANT
PACK TRENDGUARD 450 HYBRID PRO (MISCELLANEOUS) ×2 IMPLANT
PROTECTOR NERVE ULNAR (MISCELLANEOUS) ×12 IMPLANT
SET CYSTO W/LG BORE CLAMP LF (SET/KITS/TRAYS/PACK) ×6 IMPLANT
SET IRRIG TUBING LAPAROSCOPIC (IRRIGATION / IRRIGATOR) IMPLANT
SHEARS HARMONIC ACE PLUS 36CM (ENDOMECHANICALS) ×6 IMPLANT
SLEEVE XCEL OPT CAN 5 100 (ENDOMECHANICALS) ×12 IMPLANT
SUT VIC AB 0 CT1 18XCR BRD8 (SUTURE) ×8 IMPLANT
SUT VIC AB 0 CT1 8-18 (SUTURE) ×12
SUT VIC AB 2-0 CT1 (SUTURE) ×12 IMPLANT
SUT VIC AB 3-0 PS2 18 (SUTURE) ×6
SUT VIC AB 3-0 PS2 18XBRD (SUTURE) ×4 IMPLANT
SUT VICRYL 0 TIES 12 18 (SUTURE) IMPLANT
SUT VICRYL 0 UR6 27IN ABS (SUTURE) IMPLANT
SUT VICRYL 4-0 PS2 18IN ABS (SUTURE) ×6 IMPLANT
SYS BAG RETRIEVAL 10MM (BASKET)
SYSTEM BAG RETRIEVAL 10MM (BASKET) IMPLANT
TOWEL OR 17X24 6PK STRL BLUE (TOWEL DISPOSABLE) ×12 IMPLANT
TRAY FOLEY W/BAG SLVR 14FR (SET/KITS/TRAYS/PACK) ×6 IMPLANT
TRENDGUARD 450 HYBRID PRO PACK (MISCELLANEOUS) ×6
TROCAR BALLN 12MMX100 BLUNT (TROCAR) IMPLANT
TROCAR BLADELESS OPT 5 100 (ENDOMECHANICALS) ×12 IMPLANT
TROCAR XCEL NON-BLD 11X100MML (ENDOMECHANICALS) IMPLANT
TROCAR XCEL NON-BLD 5MMX100MML (ENDOMECHANICALS) ×6 IMPLANT
TUBING INSUF HEATED (TUBING) ×6 IMPLANT
WARMER LAPAROSCOPE (MISCELLANEOUS) ×6 IMPLANT

## 2017-10-01 NOTE — Op Note (Signed)
Operative Note    Preoperative Diagnosis Pelvic Pain Dyspareunia  Postoperative Diagnosis Same with one small foci of endometriosis in posterior cul-de-sac  Procedure Laparoscopic assisted vaginal hysterectomy and cystoscopy  Surgeon Huel Cote, MD Pryor Ochoa, DO  Anesthesia GETA  Fluids: EBL UOP clear IVF LR  Findings Normal appearing uterus and tubal stumps proximally, no distal tube remained bilaterally from prior BTL.  One small foci of endometriosis in posterior cul-de-sac.  Ovaries appeared WNL. Cystoscopy with normal ureteral jets bilaterally and no sutures in bladder  Specimen Uterus and cervix  Procedure Note Patient was taken to the operating room where general anesthesia was obtained without difficulty. She was then prepped and draped in the normal sterile fashion in the dorsal lithotomy position. An appropriate timeout was performed. A speculum was then placed within the vagina and a Hulka tenaculum placed within the cervix for uterine manipulation. A foley catheter was placed in the bladder.  Attention was then turned to the patient's abdomen after draping where the infraumbilical area was injected with approximately 10 cc of quarter percent Marcaine. A 1 cm incision was then made within the umbilicus and the varies needle easily introduced into the peritoneal cavity. Intraperitoneal placement was confirmed by aspiration and injection with normal saline. Gas flow was then applied and a pneumoperitoneum obtained with approximate 3 L of CO2 gas. The varies needle was then removed and a 5 mm optiview trocar was easily introduced into the abdomen under direct visualization. . With patient in Trendelenburg the uterus and tubes and ovaries were inspected with findings as previously stated. Two additional trocars were placed in the upper lateral quadrants under direct visualization after injection with quarter percent marcaine.  The Harmonic scalpel  was then utilized to dissect the uterus from the sidewalls by taking down the braod ligament and the round ligament.  The bladder flap was taken down from the lower uterine segment and pushed away to expose the cervix.   Attention was then turned to the vagina after all instruments were removed and the trocars covered with a sterile drape. The cervix was grasped with [place] tenaculums x 2 and injected with a dilute solution of Pitressin circumferentially.  The bovie was then used to make a circumferential incision.  The mayo scissors then further dissected the vaginal mucosa from the underlying cervix and the anterior and posterior cul de sac entered sharply.  With a banana speculum and deaver retractor isolating the uterus from the bladder and rectum.  The uterosacral ligaments and paracervical tissue was taken down sequentially with parametrial clamps and suture ligated with zero vicryl at each step.  When  the was uterus freed on the patient's right, it was then delivered and the remaining tissue on the left clamped and transected completely freeing the uterus and tubal stumps.  It was handed off to pathology.   A small amount of bleeding on the left was controlled with a figure of eight sutures of zero vicryl and all was hemostatic. The short weighted speculum was placed and the posterior cuff run with a running locked 2-0 vicryl for hemostasis.  The uterosacral ligaments were approximated with zero vicryl. The cuff was then closed with 2-0 vicryl in a running locked suture.   The 30 degree cystoscope was then introduced and the bladder intact with no sutures noted. The ureteral orifices were identified and seen to have normal jets bilaterally.  The foley catheter was then replaced.  All instruments were then removed from the  vagina.  Gowns and gloves were changed and attention was returned to the abdomen, where pneumoperitoneum was again obtained and all inspected.  The cuff and pedicles were hemostatic  and the ureters visualized and normal in appearance. A four quadrant view of the pelvis and abdomen was performed and found to be normal with no bleeding or injuries noted.  The instruments were removed from the abdomen as well as the 5 mm lateral ports under visualization.  The pneumoperitoneum was reduced through the trocar. The trocar was finally removed and the infraumbilical incision and lateral incisions were closed with a subcuticular stitch of 3-0 Vicryl. Dermabond and a bandage were placed. Patient was then awakened and taken to the recovery room in good condition.

## 2017-10-01 NOTE — Transfer of Care (Signed)
  Last Vitals:  Vitals Value Taken Time  BP    Temp    Pulse    Resp    SpO2      Last Pain:  Vitals:   10/01/17 0634  TempSrc:   PainSc: 4       Patients Stated Pain Goal: 8 (10/01/17 2683)  Immediate Anesthesia Transfer of Care Note  Patient: Emma Gardner  Procedure(s) Performed: Procedure(s) (LRB): LAPAROSCOPIC ASSISTED VAGINAL HYSTERECTOMY (N/A) CYSTOSCOPY (N/A)  Patient Location: PACU  Anesthesia Type: General  Level of Consciousness: awake, alert  and oriented  Airway & Oxygen Therapy: Patient Spontanous Breathing and Patient connected to nasal cannula oxygen  Post-op Assessment: Report given to PACU RN and Post -op Vital signs reviewed and stable  Post vital signs: Reviewed and stable  Complications: No apparent anesthesia complications

## 2017-10-01 NOTE — Progress Notes (Signed)
Patient ID: Emma Gardner, female   DOB: 1985/09/14, 32 y.o.   MRN: [id number] Per pt no changes in dictated H&P.  Brief exam WNL.  Ready to proceed.

## 2017-10-01 NOTE — Progress Notes (Signed)
Day of Surgery Procedure(s) (LRB): LAPAROSCOPIC ASSISTED VAGINAL HYSTERECTOMY (N/A) CYSTOSCOPY (N/A)  Subjective: Patient reports incisional pain.  She has been sleeping most of afternoon per RN but will awake and say pain 7/10.  Ambulated x 1 and had some mild nausea.  Pt states she is able to take norco without N/V  Objective: I have reviewed patient's vital signs and intake and output.  General: alert and cooperative GI: incision: clean, dry and intact and soft NT Vaginal Bleeding: minimal  Assessment: s/p Procedure(s): LAPAROSCOPIC ASSISTED VAGINAL HYSTERECTOMY (N/A) CYSTOSCOPY (N/A): stable  Plan: Advance diet as tolerated, when tolerating po will change to po vicodin for pain. UOP adequate but appears concentrated, will give bolus x 1 since her po intake has been limited with her sleepiness. Continue foley until AM  LOS: 0 days    Oliver Pila 10/01/2017, 4:12 PM

## 2017-10-01 NOTE — Anesthesia Procedure Notes (Signed)
Procedure Name: Intubation Date/Time: 10/01/2017 7:57 AM Performed by: Murvin Natal, MD Pre-anesthesia Checklist: Patient identified, Emergency Drugs available, Suction available and Patient being monitored Patient Re-evaluated:Patient Re-evaluated prior to induction Oxygen Delivery Method: Circle system utilized Preoxygenation: Pre-oxygenation with 100% oxygen Induction Type: IV induction Ventilation: Mask ventilation without difficulty Laryngoscope Size: Mac and 3 Grade View: Grade I Tube type: Oral Tube size: 7.0 mm Number of attempts: 1 Airway Equipment and Method: Stylet Placement Confirmation: ETT inserted through vocal cords under direct vision,  positive ETCO2 and breath sounds checked- equal and bilateral Secured at: 22 cm Tube secured with: Tape Dental Injury: Teeth and Oropharynx as per pre-operative assessment

## 2017-10-01 NOTE — Progress Notes (Signed)
10/01/2017 1300 Dr. Senaida Ores paged and made aware of pt. C/o continued severe cramping pain lower abdomen R > L. Abdomen soft, tender, VSS. Dr. Senaida Ores made aware of total 3 mg IV Dilaudid administered in PACU prior to admission to [facility]. MD also aware Pt. Received Celebrex pre-operatively at 0737. Verbal order received for Toradol 30 mg IV q 6 h x 3 doses. Pharmacy contacted and RN instructed to administer Toradol no sooner than 1400 today. Orders enacted. Education provided to patient and family. Will continue to closely monitor patient.  Pantera Winterrowd, Blanchard Kelch

## 2017-10-02 ENCOUNTER — Encounter (HOSPITAL_BASED_OUTPATIENT_CLINIC_OR_DEPARTMENT_OTHER): Payer: Self-pay | Admitting: Obstetrics and Gynecology

## 2017-10-02 LAB — BASIC METABOLIC PANEL
ANION GAP: 5 (ref 5–15)
BUN: 6 mg/dL (ref 6–20)
CALCIUM: 8.9 mg/dL (ref 8.9–10.3)
CHLORIDE: 107 mmol/L (ref 98–111)
CO2: 28 mmol/L (ref 22–32)
CREATININE: 0.83 mg/dL (ref 0.44–1.00)
GFR calc non Af Amer: >60 mL/min (ref >60)
Glucose, Bld: 100 mg/dL — ABNORMAL HIGH (ref 70–99)
Potassium: 3.9 mmol/L (ref 3.5–5.1)
SODIUM: 140 mmol/L (ref 135–145)

## 2017-10-02 LAB — CBC
HCT: 31.5 % — ABNORMAL LOW (ref 36.0–46.0)
Hemoglobin: 10.8 g/dL — ABNORMAL LOW (ref 12.0–15.0)
MCH: 32 pg (ref 26.0–34.0)
MCHC: 34.3 g/dL (ref 30.0–36.0)
MCV: 93.2 fL (ref 78.0–100.0)
PLATELETS: 271 10*3/uL (ref 150–400)
RBC: 3.38 MIL/uL — ABNORMAL LOW (ref 3.87–5.11)
RDW: 13.3 % (ref 11.5–15.5)
WBC: 10.8 10*3/uL — AB (ref 4.0–10.5)

## 2017-10-02 MED ORDER — HYDROCODONE-ACETAMINOPHEN 5-325 MG PO TABS
ORAL_TABLET | ORAL | Status: AC
  Filled 2017-10-02: qty 2

## 2017-10-02 MED ORDER — IBUPROFEN 600 MG PO TABS: 600.0000 mg | ORAL_TABLET | Freq: Four times a day (QID) | ORAL | 0 refills | Status: DC | PRN

## 2017-10-02 MED ORDER — IBUPROFEN 200 MG PO TABS
ORAL_TABLET | ORAL | Status: AC
  Filled 2017-10-02: qty 3

## 2017-10-02 MED ORDER — KETOROLAC TROMETHAMINE 30 MG/ML IJ SOLN
INTRAMUSCULAR | Status: AC
  Filled 2017-10-02: qty 1

## 2017-10-02 MED ORDER — HYDROCODONE-ACETAMINOPHEN 5-325 MG PO TABS: 2.0000 | ORAL_TABLET | Freq: Four times a day (QID) | ORAL | 0 refills | Status: DC | PRN

## 2017-10-02 NOTE — Anesthesia Postprocedure Evaluation (Signed)
Anesthesia Post Note  Patient: Emma Gardner  Procedure(s) Performed: LAPAROSCOPIC ASSISTED VAGINAL HYSTERECTOMY (N/A Abdomen) CYSTOSCOPY (N/A Bladder)     Patient location during evaluation: PACU Anesthesia Type: General Level of consciousness: awake and alert Pain management: pain level controlled Vital Signs Assessment: post-procedure vital signs reviewed and stable Respiratory status: spontaneous breathing, nonlabored ventilation, respiratory function stable and patient connected to nasal cannula oxygen Cardiovascular status: blood pressure returned to baseline and stable Postop Assessment: no apparent nausea or vomiting Anesthetic complications: no    Last Vitals:  Vitals:   10/02/17 0500 10/02/17 0834  BP: 102/63 111/69  Pulse: (!) 59 61  Resp: 16 18  Temp: 37.4 C 36.5 C  SpO2: 99% 100%    Last Pain:  Vitals:   10/02/17 0915  TempSrc:   PainSc: 5                  Krew Hortman P Marymargaret Kirker

## 2017-10-02 NOTE — Discharge Instructions (Signed)

## 2017-10-02 NOTE — Progress Notes (Signed)
1 Day Post-Op Procedure(s) (LRB): LAPAROSCOPIC ASSISTED VAGINAL HYSTERECTOMY (N/A) CYSTOSCOPY (N/A)  Subjective: Patient reports tolerating PO and no problems voiding.  Feeling much better with pain well-controlled and ambulating fine.  Objective: I have reviewed patient's vital signs, intake and output and labs.  General: alert and cooperative GI: incision: clean, dry and intact Vaginal Bleeding: minimal  Assessment: s/p Procedure(s): LAPAROSCOPIC ASSISTED VAGINAL HYSTERECTOMY (N/A) CYSTOSCOPY (N/A): stable  Plan: Discharge home  LOS: 0 days    Emma Gardner 10/02/2017, 9:32 AM

## 2017-10-02 NOTE — Progress Notes (Signed)
Dr. Senaida Ores called to nurses' station, Philomena Course RN updated MD with patient's status, informed MD patient due to void at this time, otherwise stable, reports RN will bladder scan patient at 0830 if patient unable to void. MD stated to stop IVF and SL PIV site, also states she will be on floor to see patient by 1000.

## 2017-10-02 NOTE — Discharge Summary (Signed)
Physician Discharge Summary  Patient ID: Emma Gardner MRN: [id number] DOB/AGE: 1985-04-22 32 y.o.  Admit date: 10/01/2017 Discharge date: 10/02/2017  Admission Diagnoses: Pelvic pain Dyspareunia  Discharge Diagnoses:  Active Problems:   S/P laparoscopic assisted vaginal hysterectomy (LAVH) s/p cystoscopy  Discharged Condition: good  Hospital Course: Pt was in observation following an uncomplicated LAVH.  By postoperative day #1 she was ambulating, voiding on her on and tolerating a regular diet and po medication.  Consults: None  Significant Diagnostic Studies: labs: CBC,BMP  Treatments: LAVH  Discharge Exam: Blood pressure 111/69, pulse 61, temperature 97.7 F (36.5 C), resp. rate 18, height 5\' 6"  (1.676 m), weight 84 kg, last menstrual period 09/10/2017, SpO2 100 %. General appearance: alert and cooperative GI: soft NT Incisions C/D/I  Disposition: Discharge disposition: 01-Home or Self Care      Home Discharge Instructions    Call MD for:  persistant nausea and vomiting   Complete by:  As directed    Call MD for:  redness, tenderness, or signs of infection (pain, swelling, redness, odor or green/yellow discharge around incision site)   Complete by:  As directed    Call MD for:  severe uncontrolled pain   Complete by:  As directed    Call MD for:  temperature >100.4   Complete by:  As directed    Diet - low sodium heart healthy   Complete by:  As directed    Discharge instructions   Complete by:  As directed    Avoid driving for at least 1-2 weeks or until off narcotic pain meds.  No heavy lifting greater than 10 lbs.  Nothing in vagina for 6 weeks.  May remove bandage in 1-2 days.  Shower over incision and pat dry.     Allergies as of 10/02/2017      Reactions   Percocet [oxycodone-acetaminophen] Nausea And Vomiting, Swelling, Other (See Comments)   Headaches onset 06/06/2012      Medication List    STOP taking these medications   multivitamin with  minerals tablet     TAKE these medications   HYDROcodone-acetaminophen 5-325 MG tablet Commonly known as:  NORCO/VICODIN Take 2 tablets by mouth every 6 (six) hours as needed for moderate pain.   ibuprofen 600 MG tablet Commonly known as:  ADVIL,MOTRIN Take 1 tablet (600 mg total) by mouth every 6 (six) hours as needed (mild pain). What changed:    medication strength  how much to take  when to take this  reasons to take this      Follow-up Information    Huel Cote, MD. Schedule an appointment as soon as possible for a visit in 2 week(s).   Specialty:  Obstetrics and Gynecology Why:  incision check Contact information: [address] [place] [address] [phone]           Signed: Oliver Pila 10/02/2017, 9:36 AM

## 2017-10-02 NOTE — Progress Notes (Signed)
Patient up to BR, voided 650 clear/yellow urine with sediment. IVF converted to SL. VSS.

## 2018-02-02 ENCOUNTER — Ambulatory Visit (INDEPENDENT_AMBULATORY_CARE_PROVIDER_SITE_OTHER): Payer: Self-pay | Admitting: Internal Medicine

## 2018-02-02 ENCOUNTER — Encounter: Payer: Self-pay | Admitting: Internal Medicine

## 2018-02-02 VITALS — BP 104/68 | HR 71 | Temp 99.0°F | Ht 66.0 in | Wt 197.4 lb

## 2018-02-02 DIAGNOSIS — J069 Acute upper respiratory infection, unspecified: Secondary | ICD-10-CM

## 2018-02-02 DIAGNOSIS — M62838 Other muscle spasm: Secondary | ICD-10-CM | POA: Insufficient documentation

## 2018-02-02 MED ORDER — METHOCARBAMOL 500 MG PO TABS: 500.0000 mg | ORAL_TABLET | Freq: Four times a day (QID) | ORAL | 0 refills | Status: DC | PRN

## 2018-02-02 NOTE — Progress Notes (Signed)
Subjective:    Patient ID: Emma Gardner, female    DOB: 08/23/1985, 33 y.o.   MRN: [id number]  HPI She is here for an acute visit for cold symptoms.  Her symptoms started 1 week ago   She is experiencing subjective fever, nasal congestion, ear pain, sore throat, mild cough, nausea and diarrhea last week, headaches and mild lightheadedness yesterday.  The main symptoms she still has is the nasal congestion, headaches and feeling like her head is clogged.  She also developed neck pain that radiates down toward her right shoulder.  Her neck is stiff and that does worsen with head movements.  Ibuprofen and ice have helped.  She also bought ICY hot pads and those have helped.  She has taken Tylenol flu, ibuprofen  Medications and allergies reviewed with patient and updated if appropriate.  Patient Active Problem List   Diagnosis Date Noted  . S/P laparoscopic assisted vaginal hysterectomy (LAVH) 10/01/2017  . Contact dermatitis 09/14/2014  . Ringworm 09/14/2014  . Dyspepsia 09/14/2014  . Pelvic pain 05/20/2012  . Anxiety state, unspecified   . Nausea alone 03/17/2012  . Situational stress 03/17/2012  . Headache(784.0) 01/15/2010  . ABDOMINAL PAIN -GENERALIZED 12/19/2008  . Diarrhea 03/13/2008    No current outpatient medications on file prior to visit.   No current facility-administered medications on file prior to visit.     Past Medical History:  Diagnosis Date  . Abnormal Pap smear   . Anxiety   . GERD (gastroesophageal reflux disease)   . H. pylori infection 2008   ?reinfx - re-tx 03/2012 triple pk  . Headache(784.0)    otc med prn  . Ovarian cyst   . Ovarian cyst   . SVD (spontaneous vaginal delivery)    x 2    Past Surgical History:  Procedure Laterality Date  . CERVICAL BIOPSY    . CYSTOSCOPY N/A 10/01/2017   Procedure: CYSTOSCOPY;  Surgeon: Huel Cote, MD;  Location: [facility];  Service: Gynecology;  Laterality: N/A;  .  ENDOMETRIAL BIOPSY     normal  . ESOPHAGOGASTRODUODENOSCOPY  04/12/2008  . ESSURE TUBAL LIGATION  07/2011  . LAPAROSCOPIC ASSISTED VAGINAL HYSTERECTOMY N/A 10/01/2017   Procedure: LAPAROSCOPIC ASSISTED VAGINAL HYSTERECTOMY;  Surgeon: Huel Cote, MD;  Location: [facility] ;  Service: Gynecology;  Laterality: N/A;  . LAPAROSCOPIC BILATERAL SALPINGECTOMY Bilateral 08/26/2013   Procedure: LAPAROSCOPIC BILATERAL SALPINGECTOMY;  Surgeon: Antionette Char, MD;  Location: [facility];  Service: Gynecology;  Laterality: Bilateral;  . LAPAROSCOPY N/A 06/04/2012   Procedure: LAPAROSCOPY DIAGNOSTIC;  Surgeon: Antionette Char, MD;  Location: [facility];  Service: Gynecology;  Laterality: N/A;  . TUBAL LIGATION      Social History   Socioeconomic History  . Marital status: Married    Spouse name: Meryssa Brander  . Number of children: 2  . Years of education: Not on file  . Highest education level: Not on file  Occupational History    Employer: [employer]  Social Needs  . Financial resource strain: Not on file  . Food insecurity:    Worry: Not on file    Inability: Not on file  . Transportation needs:    Medical: Not on file    Non-medical: Not on file  Tobacco Use  . Smoking status: Former Smoker    Types: Cigars    Last attempt to quit: 10/04/2010    Years since quitting: 7.3  . Smokeless tobacco: Never Used  Substance and Sexual  Activity  . Alcohol use: No  . Drug use: No  . Sexual activity: Yes    Partners: Male    Birth control/protection: Surgical    Comment: Salpingectomy   Lifestyle  . Physical activity:    Days per week: Not on file    Minutes per session: Not on file  . Stress: Not on file  Relationships  . Social connections:    Talks on phone: Not on file    Gets together: Not on file    Attends religious service: Not on file    Active member of club or organization: Not on file    Attends meetings of clubs or organizations: Not on file     Relationship status: Not on file  Other Topics Concern  . Not on file  Social History Narrative  . Not on file    Family History  Problem Relation Age of Onset  . Depression Mother   . Dementia Paternal Grandfather   . Dementia Paternal Grandmother   . Depression Maternal Grandmother     Review of Systems  Constitutional: Positive for fever (subjective). Negative for chills.  HENT: Positive for congestion, ear pain and sore throat.   Respiratory: Positive for cough (mild). Negative for shortness of breath and wheezing.   Cardiovascular: Negative for chest pain.  Gastrointestinal: Positive for diarrhea (last week) and nausea (last week).  Musculoskeletal: Positive for neck pain (muscular).  Neurological: Positive for light-headedness (mild, yesterday) and headaches.       Objective:   Vitals:   02/02/18 1109  BP: 104/68  Pulse: 71  Temp: 99 F (37.2 C)  SpO2: 98%   Filed Weights   02/02/18 1109  Weight: 197 lb 6.4 oz (89.5 kg)   Body mass index is 31.86 kg/m.  Wt Readings from Last 3 Encounters:  02/02/18 197 lb 6.4 oz (89.5 kg)  10/01/17 185 lb 2 oz (84 kg)  09/25/17 184 lb (83.5 kg)     Physical Exam GENERAL APPEARANCE: Appears stated age, well appearing, NAD EYES: conjunctiva clear, no icterus HEENT: bilateral tympanic membranes and ear canals normal, oropharynx with mild erythema, no thyromegaly, trachea midline, no cervical or supraclavicular lymphadenopathy LUNGS: Clear to auscultation without wheeze or crackles, unlabored breathing, good air entry bilaterally CARDIOVASCULAR: Normal S1,S2 without murmurs, no edema Musculoskeletal: Right posterior neck and upper back muscle tenderness with palpation, increased tenderness with movement of head, normal sensation strength bilateral upper extremities SKIN: warm, dry        Assessment & Plan:   See Problem List for Assessment and Plan of chronic medical problems.

## 2018-02-02 NOTE — Patient Instructions (Signed)
Your infection is likely viral - continue the tylenol flu, ibuprofen.     Take ibuprofen 600 mg three times a day with food.    Use ice and heat for your neck.  Use icy hot patches.    Take the muscle relaxer as prescribed.    Call if no improvement      Upper Respiratory Infection, Adult An upper respiratory infection (URI) is a common viral infection of the nose, throat, and upper air passages that lead to the lungs. The most common type of URI is the common cold. URIs usually get better on their own, without medical treatment. What are the causes? A URI is caused by a virus. You may catch a virus by:  Breathing in droplets from an infected person's cough or sneeze.  Touching something that has been exposed to the virus (contaminated) and then touching your mouth, nose, or eyes. What increases the risk? You are more likely to get a URI if:  You are very young or very old.  It is autumn or winter.  You have close contact with others, such as at a daycare, school, or health care facility.  You smoke.  You have long-term (chronic) heart or lung disease.  You have a weakened disease-fighting (immune) system.  You have nasal allergies or asthma.  You are experiencing a lot of stress.  You work in an area that has poor air circulation.  You have poor nutrition. What are the signs or symptoms? A URI usually involves some of the following symptoms:  Runny or stuffy (congested) nose.  Sneezing.  Cough.  Sore throat.  Headache.  Fatigue.  Fever.  Loss of appetite.  Pain in your forehead, behind your eyes, and over your cheekbones (sinus pain).  Muscle aches.  Redness or irritation of the eyes.  Pressure in the ears or face. How is this diagnosed? This condition may be diagnosed based on your medical history and symptoms, and a physical exam. Your health care provider may use a cotton swab to take a mucus sample from your nose (nasal swab). This sample  can be tested to determine what virus is causing the illness. How is this treated? URIs usually get better on their own within 7-10 days. You can take steps at home to relieve your symptoms. Medicines cannot cure URIs, but your health care provider may recommend certain medicines to help relieve symptoms, such as:  Over-the-counter cold medicines.  Cough suppressants. Coughing is a type of defense against infection that helps to clear the respiratory system, so take these medicines only as recommended by your health care provider.  Fever-reducing medicines. Follow these instructions at home: Activity  Rest as needed.  If you have a fever, stay home from work or school until your fever is gone or until your health care provider says you are no longer contagious. Your health care provider may have you wear a face mask to prevent your infection from spreading. Relieving symptoms  Gargle with a salt-water mixture 3-4 times a day or as needed. To make a salt-water mixture, completely dissolve -1 tsp of salt in 1 cup of warm water.  Use a cool-mist humidifier to add moisture to the air. This can help you breathe more easily. Eating and drinking   Drink enough fluid to keep your urine pale yellow.  Eat soups and other clear broths. General instructions   Take over-the-counter and prescription medicines only as told by your health care provider. These include cold medicines,  fever reducers, and cough suppressants.  Do not use any products that contain nicotine or tobacco, such as cigarettes and e-cigarettes. If you need help quitting, ask your health care provider.  Stay away from secondhand smoke.  Stay up to date on all immunizations, including the yearly (annual) flu vaccine.  Keep all follow-up visits as told by your health care provider. This is important. How to prevent the spread of infection to others   URIs can be passed from person to person (are contagious). To prevent  the infection from spreading: ? Wash your hands often with soap and water. If soap and water are not available, use hand sanitizer. ? Avoid touching your mouth, face, eyes, or nose. ? Cough or sneeze into a tissue or your sleeve or elbow instead of into your hand or into the air. Contact a health care provider if:  You are getting worse instead of better.  You have a fever or chills.  Your mucus is brown or red.  You have yellow or brown discharge coming from your nose.  You have pain in your face, especially when you bend forward.  You have swollen neck glands.  You have pain while swallowing.  You have white areas in the back of your throat. Get help right away if:  You have shortness of breath that gets worse.  You have severe or persistent: ? Headache. ? Ear pain. ? Sinus pain. ? Chest pain.  You have chronic lung disease along with any of the following: ? Wheezing. ? Prolonged cough. ? Coughing up blood. ? A change in your usual mucus.  You have a stiff neck.  You have changes in your: ? Vision. ? Hearing. ? Thinking. ? Mood. Summary  An upper respiratory infection (URI) is a common infection of the nose, throat, and upper air passages that lead to the lungs.  A URI is caused by a virus.  URIs usually get better on their own within 7-10 days.  Medicines cannot cure URIs, but your health care provider may recommend certain medicines to help relieve symptoms. This information is not intended to replace advice given to you by your health care provider. Make sure you discuss any questions you have with your health care provider. Document Released: 07/09/2000 Document Revised: 08/29/2016 Document Reviewed: 08/29/2016 Elsevier Interactive Patient Education  2019 [employer]

## 2018-02-02 NOTE — Assessment & Plan Note (Signed)
Symptoms likely viral in nature Continue symptomatic treatment with over-the-counter cold medications, Tylenol/ibuprofen Increase rest and fluids Call if symptoms worsen or do not improve 

## 2018-02-02 NOTE — Assessment & Plan Note (Signed)
Neck pain related to trapezius muscle spasm Continue ibuprofen-take 600 mg 3 times daily with food-stop if she develops any stomach upset Heat/ice, topical muscle medications Gentle stretching We will prescribe methocarbamol every 6 hours as needed Call if no improvement

## 2019-04-14 ENCOUNTER — Ambulatory Visit: Payer: Self-pay | Attending: Internal Medicine

## 2019-04-14 DIAGNOSIS — Z23 Encounter for immunization: Secondary | ICD-10-CM

## 2019-04-14 NOTE — Progress Notes (Signed)
   Covid-19 Vaccination Clinic  Name:  Emma Gardner    MRN: [id number] DOB: 12-31-1985  04/14/2019  Emma Gardner was observed post Covid-19 immunization for 15 minutes without incident. She was provided with Vaccine Information Sheet and instruction to access the V-Safe system.   Emma Gardner was instructed to call 911 with any severe reactions post vaccine: Marland Kitchen Difficulty breathing  . Swelling of face and throat  . A fast heartbeat  . A bad rash all over body  . Dizziness and weakness   Immunizations Administered    Name Date Dose VIS Date Route   Pfizer COVID-19 Vaccine 04/14/2019  9:20 AM 0.3 mL 01/07/2019 Intramuscular   Manufacturer: [employer], [employer]   Lot: [id number]   NDC: [address]-3

## 2019-05-09 ENCOUNTER — Ambulatory Visit: Payer: Self-pay | Attending: Internal Medicine

## 2019-05-09 DIAGNOSIS — Z23 Encounter for immunization: Secondary | ICD-10-CM

## 2019-05-09 NOTE — Progress Notes (Signed)
   Covid-19 Vaccination Clinic  Name:  [name]    MRN: [id number] DOB: 07-Jun-1985  05/09/2019  [name] was observed post Covid-19 immunization for 15 minutes without incident. She was provided with Vaccine Information Sheet and instruction to access the V-Safe system.   [name] was instructed to call 911 with any severe reactions post vaccine: Marland Kitchen Difficulty breathing  . Swelling of face and throat  . A fast heartbeat  . A bad rash all over body  . Dizziness and weakness   Immunizations Administered    Name Date Dose VIS Date Route   Pfizer COVID-19 Vaccine 05/09/2019 10:15 AM 0.3 mL 01/07/2019 Intramuscular   Manufacturer: [employer], [employer]   Lot: [id number]   NDC: [address]-6

## 2019-07-25 ENCOUNTER — Other Ambulatory Visit: Payer: Self-pay

## 2019-07-25 ENCOUNTER — Encounter (HOSPITAL_BASED_OUTPATIENT_CLINIC_OR_DEPARTMENT_OTHER): Payer: Self-pay | Admitting: Obstetrics and Gynecology

## 2019-07-25 NOTE — Progress Notes (Signed)
Spoke w/ via phone for pre-op interview---pt Lab needs dos----    cbc         COVID test ------6-29 at 1005 am Arrive at -------700 am 07-29-2019 No food after midnight clear liquids until 600 am then npo Medications to take morning of surgery -----none Diabetic medication -----n/a Patient Special Instructions -----none Pre-Op special Istructions -----none Patient verbalized understanding of instructions that were given at this phone interview. Patient denies shortness of breath, chest pain, fever, cough a this phone interview.

## 2019-07-26 ENCOUNTER — Other Ambulatory Visit (HOSPITAL_COMMUNITY)
Admission: RE | Admit: 2019-07-26 | Discharge: 2019-07-26 | Disposition: A | Discharge status: Home / Self Care | Payer: HRSA Program | Source: Ambulatory Visit | Attending: Obstetrics and Gynecology | Admitting: Obstetrics and Gynecology

## 2019-07-26 DIAGNOSIS — Z20822 Contact with and (suspected) exposure to covid-19: Secondary | ICD-10-CM | POA: Insufficient documentation

## 2019-07-26 DIAGNOSIS — Z01812 Encounter for preprocedural laboratory examination: Secondary | ICD-10-CM | POA: Insufficient documentation

## 2019-07-26 LAB — SARS CORONAVIRUS 2 (TAT 6-24 HRS): SARS Coronavirus 2: NEGATIVE

## 2019-07-28 NOTE — Anesthesia Preprocedure Evaluation (Addendum)
Anesthesia Evaluation  Patient identified by MRN, date of birth, ID band Patient awake    Reviewed: Allergy & Precautions, NPO status , Patient's Chart, lab work & pertinent test results  Airway Mallampati: II  TM Distance: >3 FB Neck ROM: Full    Dental no notable dental hx. (+) Teeth Intact, Dental Advisory Given   Pulmonary neg pulmonary ROS, former smoker,    Pulmonary exam normal breath sounds clear to auscultation       Cardiovascular Exercise Tolerance: Good negative cardio ROS Normal cardiovascular exam Rhythm:Regular Rate:Normal     Neuro/Psych  Headaches, Anxiety    GI/Hepatic Neg liver ROS, GERD  ,  Endo/Other  negative endocrine ROS  Renal/GU      Musculoskeletal negative musculoskeletal ROS (+)   Abdominal   Peds  Hematology   Anesthesia Other Findings   Reproductive/Obstetrics negative OB ROS S/P BTL                            Anesthesia Physical Anesthesia Plan  ASA: I  Anesthesia Plan: General   Post-op Pain Management:    Induction: Intravenous  PONV Risk Score and Plan: 4 or greater and Treatment may vary due to age or medical condition, Ondansetron, Dexamethasone, Scopolamine patch - Pre-op and Midazolam  Airway Management Planned: LMA  Additional Equipment:   Intra-op Plan:   Post-operative Plan:   Informed Consent: I have reviewed the patients History and Physical, chart, labs and discussed the procedure including the risks, benefits and alternatives for the proposed anesthesia with the patient or authorized representative who has indicated his/her understanding and acceptance.     Dental advisory given  Plan Discussed with: CRNA  Anesthesia Plan Comments: (GA)       Anesthesia Quick Evaluation

## 2019-07-28 NOTE — H&P (Signed)
Emma Gardner is an 34 y.o. female G3P3 with an ongoing issue of pain and irritation related to hypertrophic labia.  She reports being uncomfortable, rubbing on clothes and having difficulty keeping clean  Pertinent Gynecological History:  Previous GYN Procedures: LAVH/BS   OB History: NSVD x 3   Menstrual History:  Patient's last menstrual period was 09/10/2017.    Past Medical History:  Diagnosis Date  . Abnormal Pap smear   . GERD (gastroesophageal reflux disease)   . H. pylori infection 2008   ?reinfx - re-tx 03/2012 triple pk  . Headache(784.0)    otc med prn  . Ovarian cyst   . Ovarian cyst   . SVD (spontaneous vaginal delivery)    x 2    Past Surgical History:  Procedure Laterality Date  . CERVICAL BIOPSY  yrs ago  . CYSTOSCOPY N/A 10/01/2017   Procedure: CYSTOSCOPY;  Surgeon: Huel Cote, MD;  Location: [facility];  Service: Gynecology;  Laterality: N/A;  . ENDOMETRIAL BIOPSY  yrs ago   normal  . ESOPHAGOGASTRODUODENOSCOPY  04/12/2008  . ESSURE TUBAL LIGATION  07/2011  . LAPAROSCOPIC ASSISTED VAGINAL HYSTERECTOMY N/A 10/01/2017   Procedure: LAPAROSCOPIC ASSISTED VAGINAL HYSTERECTOMY;  Surgeon: Huel Cote, MD;  Location: [facility];  Service: Gynecology;  Laterality: N/A;  . LAPAROSCOPIC BILATERAL SALPINGECTOMY Bilateral 08/26/2013   Procedure: LAPAROSCOPIC BILATERAL SALPINGECTOMY;  Surgeon: Antionette Char, MD;  Location: [facility];  Service: Gynecology;  Laterality: Bilateral;  . LAPAROSCOPY N/A 06/04/2012   Procedure: LAPAROSCOPY DIAGNOSTIC;  Surgeon: Antionette Char, MD;  Location: [facility];  Service: Gynecology;  Laterality: N/A;  . TUBAL LIGATION      Family History  Problem Relation Age of Onset  . Depression Mother   . Dementia Paternal Grandfather   . Dementia Paternal Grandmother   . Depression Maternal Grandmother     Social History:  reports that she quit smoking about 8 years ago. Her smoking use included  cigars. She has never used smokeless tobacco. She reports that she does not drink alcohol and does not use drugs.  Allergies:  Allergies  Allergen Reactions  . Percocet [Oxycodone-Acetaminophen] Nausea And Vomiting, Swelling and Other (See Comments)    Headaches onset 06/06/2012    No medications prior to admission.    Review of Systems  Constitutional: Negative for fever.  Gastrointestinal: Negative for abdominal pain.  Genitourinary: Positive for vaginal pain (redundant labia irritated and swollen).    Height 5\' 5"  (1.651 m), weight 81.6 kg, last menstrual period 09/10/2017. Physical Exam Cardiovascular:     Rate and Rhythm: Normal rate and regular rhythm.  Pulmonary:     Effort: Pulmonary effort is normal.  Abdominal:     Palpations: Abdomen is soft.  Genitourinary:    Comments: Labia minora with hypertrophy bilaterally, about 3-4 cm redundant tissue on both sides Neurological:     Mental Status: She is alert.     No results found for this or any previous visit (from the past 24 hour(s)).  No results found.  Assessment/Plan: D/w pt in detail recovery and pain s/p labioplasty.  We discussed risks of bleeding and infection.  I showed her exactly how much tissue we would remove and what the end result would be.  She is ready to proceed.  09/12/2017 07/28/2019, 8:57 PM

## 2019-07-29 ENCOUNTER — Encounter (HOSPITAL_BASED_OUTPATIENT_CLINIC_OR_DEPARTMENT_OTHER)
Admission: RE | Disposition: A | Discharge status: Home / Self Care | Payer: Self-pay | Source: Home / Self Care | Attending: Obstetrics and Gynecology

## 2019-07-29 ENCOUNTER — Encounter (HOSPITAL_BASED_OUTPATIENT_CLINIC_OR_DEPARTMENT_OTHER): Payer: Self-pay | Admitting: Obstetrics and Gynecology

## 2019-07-29 ENCOUNTER — Ambulatory Visit (HOSPITAL_BASED_OUTPATIENT_CLINIC_OR_DEPARTMENT_OTHER): Payer: Self-pay | Admitting: Anesthesiology

## 2019-07-29 ENCOUNTER — Ambulatory Visit (HOSPITAL_BASED_OUTPATIENT_CLINIC_OR_DEPARTMENT_OTHER)
Admission: RE | Admit: 2019-07-29 | Discharge: 2019-07-29 | Disposition: A | Discharge status: Home / Self Care | Payer: Self-pay | Attending: Obstetrics and Gynecology | Admitting: Obstetrics and Gynecology

## 2019-07-29 ENCOUNTER — Other Ambulatory Visit: Payer: Self-pay

## 2019-07-29 DIAGNOSIS — N906 Unspecified hypertrophy of vulva: Secondary | ICD-10-CM | POA: Insufficient documentation

## 2019-07-29 DIAGNOSIS — Z885 Allergy status to narcotic agent status: Secondary | ICD-10-CM | POA: Insufficient documentation

## 2019-07-29 DIAGNOSIS — Z87891 Personal history of nicotine dependence: Secondary | ICD-10-CM | POA: Insufficient documentation

## 2019-07-29 HISTORY — PX: LABIOPLASTY: SHX1900

## 2019-07-29 LAB — CBC
HCT: 36.7 % (ref 36.0–46.0)
Hemoglobin: 12.4 g/dL (ref 12.0–15.0)
MCH: 32.2 pg (ref 26.0–34.0)
MCHC: 33.8 g/dL (ref 30.0–36.0)
MCV: 95.3 fL (ref 80.0–100.0)
Platelets: 330 10*3/uL (ref 150–400)
RBC: 3.85 MIL/uL — ABNORMAL LOW (ref 3.87–5.11)
RDW: 12.5 % (ref 11.5–15.5)
WBC: 6.4 10*3/uL (ref 4.0–10.5)
nRBC: 0 % (ref 0.0–0.2)

## 2019-07-29 SURGERY — LABIAPLASTY, VULVA
Anesthesia: General | Laterality: Bilateral

## 2019-07-29 MED ORDER — PROPOFOL 10 MG/ML IV BOLUS
INTRAVENOUS | Status: DC | PRN
  Administered 2019-07-29: 200 mg via INTRAVENOUS

## 2019-07-29 MED ORDER — SCOPOLAMINE 1 MG/3DAYS TD PT72
1.0000 | MEDICATED_PATCH | TRANSDERMAL | Status: DC
  Administered 2019-07-29: 1.5 mg via TRANSDERMAL

## 2019-07-29 MED ORDER — ONDANSETRON HCL 4 MG/2ML IJ SOLN
4.0000 mg | Freq: Once | INTRAMUSCULAR | Status: AC | PRN
  Administered 2019-07-29: 4 mg via INTRAVENOUS

## 2019-07-29 MED ORDER — KETOROLAC TROMETHAMINE 30 MG/ML IJ SOLN
INTRAMUSCULAR | Status: AC
  Filled 2019-07-29: qty 1

## 2019-07-29 MED ORDER — LACTATED RINGERS IV SOLN: INTRAVENOUS | Status: DC

## 2019-07-29 MED ORDER — OXYCODONE HCL 5 MG PO TABS: 5.0000 mg | ORAL_TABLET | Freq: Once | ORAL | Status: DC | PRN

## 2019-07-29 MED ORDER — LIDOCAINE 2% (20 MG/ML) 5 ML SYRINGE
INTRAMUSCULAR | Status: DC | PRN
  Administered 2019-07-29: 100 mg via INTRAVENOUS

## 2019-07-29 MED ORDER — MIDAZOLAM HCL 2 MG/2ML IJ SOLN
INTRAMUSCULAR | Status: AC
  Filled 2019-07-29: qty 2

## 2019-07-29 MED ORDER — KETOROLAC TROMETHAMINE 30 MG/ML IJ SOLN
INTRAMUSCULAR | Status: DC | PRN
  Administered 2019-07-29: 30 mg via INTRAVENOUS

## 2019-07-29 MED ORDER — FENTANYL CITRATE (PF) 100 MCG/2ML IJ SOLN
INTRAMUSCULAR | Status: DC | PRN
  Administered 2019-07-29 (×2): 50 ug via INTRAVENOUS

## 2019-07-29 MED ORDER — LIDOCAINE HCL 1 % IJ SOLN
INTRAMUSCULAR | Status: DC | PRN
  Administered 2019-07-29: 20 mL

## 2019-07-29 MED ORDER — ACETAMINOPHEN 500 MG PO TABS
ORAL_TABLET | ORAL | Status: AC
  Filled 2019-07-29: qty 2

## 2019-07-29 MED ORDER — HYDROCODONE-ACETAMINOPHEN 5-325 MG PO TABS: 1.0000 | ORAL_TABLET | Freq: Four times a day (QID) | ORAL | 0 refills | Status: DC | PRN

## 2019-07-29 MED ORDER — MIDAZOLAM HCL 5 MG/5ML IJ SOLN
INTRAMUSCULAR | Status: DC | PRN
  Administered 2019-07-29: 2 mg via INTRAVENOUS

## 2019-07-29 MED ORDER — KETOROLAC TROMETHAMINE 30 MG/ML IJ SOLN: 30.0000 mg | Freq: Once | INTRAMUSCULAR | Status: DC | PRN

## 2019-07-29 MED ORDER — DEXAMETHASONE SODIUM PHOSPHATE 10 MG/ML IJ SOLN
INTRAMUSCULAR | Status: AC
  Filled 2019-07-29: qty 1

## 2019-07-29 MED ORDER — ACETAMINOPHEN 500 MG PO TABS
1000.0000 mg | ORAL_TABLET | Freq: Once | ORAL | Status: AC
  Administered 2019-07-29: 1000 mg via ORAL

## 2019-07-29 MED ORDER — FENTANYL CITRATE (PF) 100 MCG/2ML IJ SOLN
INTRAMUSCULAR | Status: AC
  Filled 2019-07-29: qty 2

## 2019-07-29 MED ORDER — SCOPOLAMINE 1 MG/3DAYS TD PT72
MEDICATED_PATCH | TRANSDERMAL | Status: AC
  Filled 2019-07-29: qty 1

## 2019-07-29 MED ORDER — ONDANSETRON HCL 4 MG/2ML IJ SOLN
INTRAMUSCULAR | Status: AC
  Filled 2019-07-29: qty 2

## 2019-07-29 MED ORDER — HYDROMORPHONE HCL 1 MG/ML IJ SOLN
INTRAMUSCULAR | Status: AC
  Filled 2019-07-29: qty 1

## 2019-07-29 MED ORDER — OXYCODONE HCL 5 MG/5ML PO SOLN: 5.0000 mg | Freq: Once | ORAL | Status: DC | PRN

## 2019-07-29 MED ORDER — LIDOCAINE 2% (20 MG/ML) 5 ML SYRINGE
INTRAMUSCULAR | Status: AC
  Filled 2019-07-29: qty 5

## 2019-07-29 MED ORDER — DEXAMETHASONE SODIUM PHOSPHATE 10 MG/ML IJ SOLN
INTRAMUSCULAR | Status: DC | PRN
  Administered 2019-07-29: 10 mg via INTRAVENOUS

## 2019-07-29 MED ORDER — PROPOFOL 10 MG/ML IV BOLUS
INTRAVENOUS | Status: AC
  Filled 2019-07-29: qty 40

## 2019-07-29 MED ORDER — HYDROMORPHONE HCL 1 MG/ML IJ SOLN
0.2500 mg | INTRAMUSCULAR | Status: DC | PRN
  Administered 2019-07-29: 0.25 mg via INTRAVENOUS

## 2019-07-29 SURGICAL SUPPLY — 27 items
BLADE SURG 15 STRL LF DISP TIS (BLADE) ×1 IMPLANT
BLADE SURG 15 STRL SS (BLADE) ×3
COVER WAND RF STERILE (DRAPES) ×2 IMPLANT
ELECT REM PT RETURN 9FT ADLT (ELECTROSURGICAL) ×3
ELECTRODE REM PT RTRN 9FT ADLT (ELECTROSURGICAL) ×1 IMPLANT
GAUZE PETROLATUM 1 X8 (GAUZE/BANDAGES/DRESSINGS) ×2 IMPLANT
GAUZE SPONGE 4X4 12PLY STRL LF (GAUZE/BANDAGES/DRESSINGS) ×2 IMPLANT
GLOVE BIO SURGEON STRL SZ 6.5 (GLOVE) ×2 IMPLANT
GLOVE BIO SURGEONS STRL SZ 6.5 (GLOVE) ×1
GLOVE BIOGEL PI IND STRL 6.5 (GLOVE) IMPLANT
GLOVE BIOGEL PI IND STRL 7.0 (GLOVE) ×1 IMPLANT
GLOVE BIOGEL PI INDICATOR 6.5 (GLOVE) ×2
GLOVE BIOGEL PI INDICATOR 7.0 (GLOVE) ×4
GLOVE ECLIPSE 6.5 STRL STRAW (GLOVE) ×2 IMPLANT
GOWN STRL REUS W/ TWL LRG LVL3 (GOWN DISPOSABLE) ×2 IMPLANT
GOWN STRL REUS W/TWL LRG LVL3 (GOWN DISPOSABLE) ×6
HIBICLENS CHG 4% 4OZ (MISCELLANEOUS) ×1 IMPLANT
PACK VAGINAL MINOR WOMEN LF (CUSTOM PROCEDURE TRAY) ×3 IMPLANT
PAD OB MATERNITY 4.3X12.25 (PERSONAL CARE ITEMS) ×3 IMPLANT
PENCIL BUTTON HOLSTER BLD 10FT (ELECTRODE) ×2 IMPLANT
SUT VIC AB 3-0 SH 27 (SUTURE) ×9
SUT VIC AB 3-0 SH 27X BRD (SUTURE) IMPLANT
SUT VIC AB 3-0 SH 27XBRD (SUTURE) IMPLANT
TOWEL OR 17X26 10 PK STRL BLUE (TOWEL DISPOSABLE) ×4 IMPLANT
TUBE CONNECTING 12'X1/4 (SUCTIONS) ×1
TUBE CONNECTING 12X1/4 (SUCTIONS) ×1 IMPLANT
YANKAUER SUCT BULB TIP NO VENT (SUCTIONS) ×2 IMPLANT

## 2019-07-29 NOTE — Progress Notes (Signed)
Patient ID: Emma Gardner, female   DOB: 1985-07-28, 34 y.o.   MRN: [id number] Per pt no changes in dictated H&P, brief exam WNL.  Ready to proceed.

## 2019-07-29 NOTE — Discharge Instructions (Signed)

## 2019-07-29 NOTE — Anesthesia Procedure Notes (Addendum)
Procedure Name: LMA Insertion Date/Time: 07/29/2019 8:52 AM Performed by: Briant Sites, CRNA Pre-anesthesia Checklist: Patient identified, Emergency Drugs available, Suction available and Patient being monitored Patient Re-evaluated:Patient Re-evaluated prior to induction Oxygen Delivery Method: Circle system utilized Preoxygenation: Pre-oxygenation with 100% oxygen Induction Type: IV induction Ventilation: Mask ventilation without difficulty LMA: LMA inserted LMA Size: 4.0 Number of attempts: 1 Airway Equipment and Method: Bite block Placement Confirmation: positive ETCO2 Dental Injury: Teeth and Oropharynx as per pre-operative assessment

## 2019-07-29 NOTE — Transfer of Care (Signed)
Immediate Anesthesia Transfer of Care Note  Patient: Emma Gardner  Procedure(s) Performed: LABIAPLASTY (Bilateral )  Patient Location: PACU  Anesthesia Type:General  Level of Consciousness: drowsy  Airway & Oxygen Therapy: Patient Spontanous Breathing and Patient connected to nasal cannula oxygen  Post-op Assessment: Report given to RN  Post vital signs: Reviewed and stable  Last Vitals:  Vitals Value Taken Time  BP 118/72   Temp 36.5 C 07/29/19 0941  Pulse 79 07/29/19 0942  Resp 12 07/29/19 0942  SpO2 100 % 07/29/19 0942  Vitals shown include unvalidated device data.  Last Pain:  Vitals:   07/29/19 0730  TempSrc: Oral  PainSc: 0-No pain      Patients Stated Pain Goal: 5 (07/29/19 0730)  Complications: No complications documented.

## 2019-07-29 NOTE — Op Note (Signed)
Operative Note    Preoperative Diagnosis Hypertrophic labia  Postoperative Diagnosis same  Procedure Bilateral labioplasties   Surgeon Huel Cote, MD  Anesthesia LMA  Fluids: EBL UOP voided prior to procedure IVF  Findings Hypertrophic labia bilaterally.  Approximately 1cm by 3cm of tissue removed  Specimen Labial skin disposed  Procedure Note Pt was taken to OR where LMA anesthesia was obtained without difficulty.  Examined and labia marked with marker for area to be excised.  Scalpel used to excise redundant tissue bilaterally.  Raw edge of labia sutured with 2-0 vicryl in a running locked suture for hemostasis.  Two areas near the clitoral hood required additional suture for hemostasis.  Vaseline gauze placed over incisions and ice pack.  Pt awakened and taken to PACU in good condition.

## 2019-07-29 NOTE — Anesthesia Postprocedure Evaluation (Signed)
Anesthesia Post Note  Patient: [name]  Procedure(s) Performed: LABIAPLASTY (Bilateral )     Patient location during evaluation: PACU Anesthesia Type: General Level of consciousness: awake and alert Pain management: pain level controlled Vital Signs Assessment: post-procedure vital signs reviewed and stable Respiratory status: spontaneous breathing, nonlabored ventilation, respiratory function stable and patient connected to nasal cannula oxygen Cardiovascular status: blood pressure returned to baseline and stable Postop Assessment: no apparent nausea or vomiting Anesthetic complications: no   No complications documented.  Last Vitals:  Vitals:   07/29/19 0945 07/29/19 1045  BP: 114/74 122/83  Pulse: 70 61  Resp: 16 16  Temp:  36.6 C  SpO2: 100% 100%    Last Pain:  Vitals:   07/29/19 1045  TempSrc:   PainSc: 2                  Trevor Iha

## 2019-08-02 ENCOUNTER — Encounter (HOSPITAL_BASED_OUTPATIENT_CLINIC_OR_DEPARTMENT_OTHER): Payer: Self-pay | Admitting: Obstetrics and Gynecology

## 2019-10-07 DIAGNOSIS — R102 Pelvic and perineal pain: Secondary | ICD-10-CM | POA: Insufficient documentation

## 2019-10-07 DIAGNOSIS — G8929 Other chronic pain: Secondary | ICD-10-CM | POA: Insufficient documentation

## 2019-10-10 ENCOUNTER — Ambulatory Visit: Payer: Self-pay | Admitting: Internal Medicine

## 2019-10-18 ENCOUNTER — Telehealth (INDEPENDENT_AMBULATORY_CARE_PROVIDER_SITE_OTHER): Payer: Self-pay | Admitting: Critical Care Medicine

## 2019-10-18 DIAGNOSIS — R102 Pelvic and perineal pain: Secondary | ICD-10-CM

## 2019-10-18 DIAGNOSIS — F411 Generalized anxiety disorder: Secondary | ICD-10-CM

## 2019-10-18 DIAGNOSIS — Z1159 Encounter for screening for other viral diseases: Secondary | ICD-10-CM

## 2019-10-18 DIAGNOSIS — Z13220 Encounter for screening for lipoid disorders: Secondary | ICD-10-CM

## 2019-10-18 DIAGNOSIS — F439 Reaction to severe stress, unspecified: Secondary | ICD-10-CM

## 2019-10-18 DIAGNOSIS — G8929 Other chronic pain: Secondary | ICD-10-CM

## 2019-10-18 DIAGNOSIS — F410 Panic disorder [episodic paroxysmal anxiety] without agoraphobia: Secondary | ICD-10-CM | POA: Insufficient documentation

## 2019-10-18 DIAGNOSIS — Z131 Encounter for screening for diabetes mellitus: Secondary | ICD-10-CM

## 2019-10-18 DIAGNOSIS — Z7689 Persons encountering health services in other specified circumstances: Secondary | ICD-10-CM

## 2019-10-18 NOTE — Assessment & Plan Note (Signed)
Severe generalized anxiety disorder which has been previously addressed by her primary care physician in the past  Plan here will be to refer her to Jenel Lucks our clinical social worker and also to behavioral health provider as soon as possible she will also be seen face-to-face with Dr. Earlene Plater within the next few weeks.  I was not comfortable giving Xanax without seeing her directly face-to-face

## 2019-10-18 NOTE — Assessment & Plan Note (Signed)
Patient's had situational distress documented in the past and continues to appear to have this with her current lifestyle

## 2019-10-18 NOTE — Progress Notes (Signed)
Subjective:    Patient ID: Emma Gardner, female    DOB: 12-Aug-1985, 34 y.o.   MRN: [id number] Virtual Visit via Telephone Note  I connected with Emma Gardner on 10/18/19 at  9:30 AM EDT by telephone and verified that I am speaking with the correct person using two identifiers.   Consent:  I discussed the limitations, risks, security and privacy concerns of performing an evaluation and management service by telephone and the availability of in person appointments. I also discussed with the patient that there may be a patient responsible charge related to this service. The patient expressed understanding and agreed to proceed.  Location of patient: Patient was at home  Location of provider: I am in my office  Persons participating in the televisit with the patient.    No one else on the call   History of Present Illness:  10/18/2019 This is a telephone visit with this 34 year old female establishing care at this visit with  [facility] primary care here for follow-up.  We initially try to get a video visit but she could not get her phone to connect to the video system.  The patient largest complaint is that of significant anxiety which is generalized.  She has significant fears and has ongoing panic attacks.  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  She feels overwhelmed at this time.  She had a bout of arm numbness in the left arm without chest pain.  She did not seek care at that time.  She also has binge eating at times and associated headaches when she does not eat.  Note the patient did receive the Pfizer Covid vaccine earlier in the year.  Patient also has a history of prior partial hysterectomy still has ovaries in place with ovarian cyst and takes ibuprofen as needed for pelvic pain.  She does not  use tobacco or alcohol.  She is requesting follow-up lab studies.  Note she also 70 care for her sister's child after her sister died last year.  A lot of this seems to be caving in on the patient at this time.  She is not on any medications.  She is requesting Xanax which I declined at this visit without full exam   Past Medical History:  Diagnosis Date  . Abnormal Pap smear   . GERD (gastroesophageal reflux disease)   . H. pylori infection 2008   ?reinfx - re-tx 03/2012 triple pk  . Headache(784.0)    otc med prn  . Ovarian cyst   . Ovarian cyst   . SVD (spontaneous vaginal delivery)    x 2     Family History  Problem Relation Age of Onset  . Depression Mother   . Dementia Paternal Grandfather   . Dementia Paternal Grandmother   . Depression Maternal Grandmother      Social History   Socioeconomic History  . Marital status: Married    Spouse name: Lamar Naef  . Number of children: 2  . Years of education: Not on file  . Highest education level: Not on file  Occupational History    Employer: [employer]  Tobacco Use  . Smoking status: Former Smoker    Types: Cigars    Quit date: 10/04/2010    Years since quitting: 9.0  .  Smokeless tobacco: Never Used  Vaping Use  . Vaping Use: Never used  Substance and Sexual Activity  . Alcohol use: No  . Drug use: No  . Sexual activity: Yes    Partners: Male    Birth control/protection: Surgical    Comment: Salpingectomy   Other Topics Concern  . Not on file  Social History Narrative  . Not on file   Social Determinants of Health   Financial Resource Strain:   . Difficulty of Paying Living Expenses: Not on file  Food Insecurity:   . Worried About Programme researcher, broadcasting/film/video in the Last Year: Not on file  . Ran Out of Food in the Last Year: Not on file  Transportation Needs:   . Lack of Transportation (Medical): Not on file  . Lack of Transportation (Non-Medical): Not on file  Physical Activity:   . Days of Exercise  per Week: Not on file  . Minutes of Exercise per Session: Not on file  Stress:   . Feeling of Stress : Not on file  Social Connections:   . Frequency of Communication with Friends and Family: Not on file  . Frequency of Social Gatherings with Friends and Family: Not on file  . Attends Religious Services: Not on file  . Active Member of Clubs or Organizations: Not on file  . Attends Banker Meetings: Not on file  . Marital Status: Not on file  Intimate Partner Violence:   . Fear of Current or Ex-Partner: Not on file  . Emotionally Abused: Not on file  . Physically Abused: Not on file  . Sexually Abused: Not on file     Allergies  Allergen Reactions  . Percocet [Oxycodone-Acetaminophen] Nausea And Vomiting, Swelling and Other (See Comments)    Headaches onset 06/06/2012     Outpatient Medications Prior to Visit  Medication Sig Dispense Refill  . acetaminophen (TYLENOL) 325 MG tablet Take 650 mg by mouth every 6 (six) hours as needed.    Marland Kitchen HYDROcodone-acetaminophen (NORCO) 5-325 MG tablet Take 1 tablet by mouth every 6 (six) hours as needed for moderate pain. 20 tablet 0  . naproxen (NAPROSYN) 250 MG tablet Take by mouth 2 (two) times daily with a meal.     No facility-administered medications prior to visit.     Review of Systems  Constitutional: Negative.   HENT: Negative.   Eyes: Negative.   Respiratory: Negative for apnea, cough, choking, chest tightness, shortness of breath, wheezing and stridor.   Cardiovascular: Positive for chest pain. Negative for palpitations.  Gastrointestinal: Negative.   Endocrine: Negative.   Genitourinary: Negative.  Negative for difficulty urinating.  Musculoskeletal: Negative.   Skin: Negative for rash.  Neurological: Positive for dizziness, light-headedness, numbness and headaches.  Psychiatric/Behavioral: Positive for agitation, behavioral problems, decreased concentration and sleep disturbance. Negative for confusion,  dysphoric mood, hallucinations, self-injury and suicidal ideas. The patient is nervous/anxious and is hyperactive.        Objective:   Physical Exam No exam this is a phone visit       Assessment & Plan:  I personally reviewed all images and lab data in the [facility] system as well as any outside material available during this office visit and agree with the  radiology impressions.   Chronic pelvic pain in female Chronic pelvic pain in the patient has establish care with gynecology  Patient continues ibuprofen as needed for pain and asked to continue to establish care with gynecology  Generalized anxiety disorder Severe  generalized anxiety disorder which has been previously addressed by her primary care physician in the past  Plan here will be to refer her to Jenel Lucks our clinical social worker and also to behavioral health provider as soon as possible she will also be seen face-to-face with Dr. Earlene Plater within the next few weeks.  I was not comfortable giving Xanax without seeing her directly face-to-face  Panic attacks As per anxiety disorder assessment  Situational stress Patient's had situational distress documented in the past and continues to appear to have this with her current lifestyle   Wilhemenia was seen today for establish care and anxiety.  Diagnoses and all orders for this visit:  Encounter to establish care  Need for hepatitis C screening test -     HCV Ab w/Rflx to Verification; Future  Chronic pelvic pain in female -     Comprehensive metabolic panel; Future -     CBC with Differential/Platelet; Future  Lipid screening -     Lipid Panel; Future  Diabetes mellitus screening -     Hemoglobin A1c; Future  Panic attacks  Generalized anxiety disorder  Situational stress  The patient indicates she will consider the flu vaccine when she comes for a face-to-face visit upcoming  Screening labs were obtained including lipid panel hemoglobin A1c metabolic  panel and complete blood counts hepatitis C assay and the patient will come in for a lab visit for this then see Dr. Earlene Plater in follow-up  Follow Up Instructions: The patient knows an in person exam will be scheduled and an appointment with Jenel Lucks our clinical social worker also be obtained for severe anxiety   I discussed the assessment and treatment plan with the patient. The patient was provided an opportunity to ask questions and all were answered. The patient agreed with the plan and demonstrated an understanding of the instructions.   The patient was advised to call back or seek an in-person evaluation if the symptoms worsen or if the condition fails to improve as anticipated.  I provided 30 minutes of non-face-to-face time during this encounter  including  median intraservice time , review of notes, labs, imaging, medications  and explaining diagnosis and management to the patient .    Shan Levans, MD

## 2019-10-18 NOTE — Assessment & Plan Note (Signed)
As per anxiety disorder assessment

## 2019-10-18 NOTE — Assessment & Plan Note (Signed)
Chronic pelvic pain in the patient has establish care with gynecology  Patient continues ibuprofen as needed for pain and asked to continue to establish care with gynecology

## 2019-10-21 ENCOUNTER — Other Ambulatory Visit: Payer: Self-pay

## 2019-10-21 ENCOUNTER — Other Ambulatory Visit (INDEPENDENT_AMBULATORY_CARE_PROVIDER_SITE_OTHER): Payer: Self-pay

## 2019-10-21 DIAGNOSIS — Z131 Encounter for screening for diabetes mellitus: Secondary | ICD-10-CM

## 2019-10-21 DIAGNOSIS — R102 Pelvic and perineal pain: Secondary | ICD-10-CM

## 2019-10-21 DIAGNOSIS — Z13220 Encounter for screening for lipoid disorders: Secondary | ICD-10-CM

## 2019-10-21 DIAGNOSIS — G8929 Other chronic pain: Secondary | ICD-10-CM

## 2019-10-21 DIAGNOSIS — Z1159 Encounter for screening for other viral diseases: Secondary | ICD-10-CM

## 2019-10-22 LAB — HEMOGLOBIN A1C
Est. average glucose Bld gHb Est-mCnc: 105 mg/dL
Hgb A1c MFr Bld: 5.3 % (ref 4.8–5.6)

## 2019-10-22 LAB — CBC WITH DIFFERENTIAL/PLATELET
Basophils Absolute: 0.1 10*3/uL (ref 0.0–0.2)
Basos: 1 %
EOS (ABSOLUTE): 0.3 10*3/uL (ref 0.0–0.4)
Eos: 4 %
Hematocrit: 37.7 % (ref 34.0–46.6)
Hemoglobin: 12.7 g/dL (ref 11.1–15.9)
Immature Grans (Abs): 0 10*3/uL (ref 0.0–0.1)
Immature Granulocytes: 0 %
Lymphocytes Absolute: 3.3 10*3/uL — ABNORMAL HIGH (ref 0.7–3.1)
Lymphs: 44 %
MCH: 32.1 pg (ref 26.6–33.0)
MCHC: 33.7 g/dL (ref 31.5–35.7)
MCV: 95 fL (ref 79–97)
Monocytes Absolute: 0.5 10*3/uL (ref 0.1–0.9)
Monocytes: 7 %
Neutrophils Absolute: 3.3 10*3/uL (ref 1.4–7.0)
Neutrophils: 44 %
Platelets: 363 10*3/uL (ref 150–450)
RBC: 3.96 x10E6/uL (ref 3.77–5.28)
RDW: 12.4 % (ref 11.7–15.4)
WBC: 7.4 10*3/uL (ref 3.4–10.8)

## 2019-10-22 LAB — COMPREHENSIVE METABOLIC PANEL
ALT: 12 IU/L (ref 0–32)
AST: 14 IU/L (ref 0–40)
Albumin/Globulin Ratio: 1.7 (ref 1.2–2.2)
Albumin: 4.3 g/dL (ref 3.8–4.8)
Alkaline Phosphatase: 39 IU/L — ABNORMAL LOW (ref 44–121)
BUN/Creatinine Ratio: 13 (ref 9–23)
BUN: 11 mg/dL (ref 6–20)
Bilirubin Total: 0.3 mg/dL (ref 0.0–1.2)
CO2: 26 mmol/L (ref 20–29)
Calcium: 9.4 mg/dL (ref 8.7–10.2)
Chloride: 105 mmol/L (ref 96–106)
Creatinine, Ser: 0.88 mg/dL (ref 0.57–1.00)
GFR calc Af Amer: 98 mL/min/{1.73_m2} (ref >59)
GFR calc non Af Amer: 85 mL/min/{1.73_m2} (ref >59)
Globulin, Total: 2.5 g/dL (ref 1.5–4.5)
Glucose: 83 mg/dL (ref 65–99)
Potassium: 4.6 mmol/L (ref 3.5–5.2)
Sodium: 141 mmol/L (ref 134–144)
Total Protein: 6.8 g/dL (ref 6.0–8.5)

## 2019-10-22 LAB — LIPID PANEL
Chol/HDL Ratio: 2.8 ratio (ref 0.0–4.4)
Cholesterol, Total: 230 mg/dL — ABNORMAL HIGH (ref 100–199)
HDL: 82 mg/dL (ref >39)
LDL Chol Calc (NIH): 137 mg/dL — ABNORMAL HIGH (ref 0–99)
Triglycerides: 65 mg/dL (ref 0–149)
VLDL Cholesterol Cal: 11 mg/dL (ref 5–40)

## 2019-10-22 LAB — HCV INTERPRETATION

## 2019-10-22 LAB — HCV AB W/RFLX TO VERIFICATION: HCV Ab: <0.1 s/co ratio (ref 0.0–0.9)

## 2019-11-01 ENCOUNTER — Telehealth: Payer: Self-pay

## 2019-11-01 ENCOUNTER — Institutional Professional Consult (permissible substitution): Payer: Self-pay | Admitting: Licensed Clinical Social Worker

## 2019-11-01 NOTE — Telephone Encounter (Signed)
Call transferred from [facility]. Patient requesting more information about her lab results.    Informed her that per Dr Wright:her cholesterol is high, for now she can: take one omega 3 fish oil capsule daily, and eat a healthy diet of fresh fruits, vegeables, lean meats, fish , chicken/turkey  A1c normal no diabetes. Blood counts, kidney liver normal, hep C neg.  The patient stated that she was aware her cholesterol was high but she expected a call from the provider to discuss the results.  She said that she never received a message about her lab results.  This CM reviewed her results with her chol/LDL/HDL/Tri and she was pleased to know that information.   She was not fasting when the labs were done. Reminded her of the recommendations from Dr Delford Field to help manage her cholesterol. She said she understood and would plan to follow up with Dr Earlene Plater as scheduled 12/05/2019.   She was upset about a referral to Jenel Lucks, LCSW. She explained that she was not aware of that referral and that she had an appointment with [facility] today.  She already has a therapist and cancelled today's appointment.

## 2019-11-02 NOTE — Telephone Encounter (Signed)
I spoke to the patient.  I went over all her lab tests.  The CMA had tried to reach her on the 27th and no connection made and VM left to call back.    Also I had offered a social work consult for behavioral treatment, the pt accepted that but now realizes she has a therapist currently for her anxiety and did not want another provider.  I requested she f/u with the provider for her November appt.

## 2019-11-08 ENCOUNTER — Institutional Professional Consult (permissible substitution): Payer: Self-pay | Admitting: Licensed Clinical Social Worker

## 2019-12-05 ENCOUNTER — Encounter: Payer: Self-pay | Admitting: Internal Medicine

## 2020-02-08 ENCOUNTER — Ambulatory Visit: Payer: Self-pay | Admitting: Family

## 2020-02-09 ENCOUNTER — Ambulatory Visit: Payer: Self-pay | Admitting: Internal Medicine

## 2020-04-06 ENCOUNTER — Ambulatory Visit: Payer: Self-pay | Admitting: Family

## 2021-11-08 ENCOUNTER — Encounter: Payer: Self-pay | Admitting: Physician Assistant

## 2021-11-08 ENCOUNTER — Ambulatory Visit (INDEPENDENT_AMBULATORY_CARE_PROVIDER_SITE_OTHER): Payer: Self-pay

## 2021-11-08 ENCOUNTER — Ambulatory Visit (INDEPENDENT_AMBULATORY_CARE_PROVIDER_SITE_OTHER): Payer: Self-pay | Admitting: Physician Assistant

## 2021-11-08 DIAGNOSIS — G8929 Other chronic pain: Secondary | ICD-10-CM

## 2021-11-08 DIAGNOSIS — M545 Low back pain, unspecified: Secondary | ICD-10-CM

## 2021-11-08 DIAGNOSIS — M5440 Lumbago with sciatica, unspecified side: Secondary | ICD-10-CM

## 2021-11-08 MED ORDER — METHYLPREDNISOLONE 4 MG PO TBPK: ORAL_TABLET | ORAL | 0 refills | Status: DC

## 2021-11-08 NOTE — Progress Notes (Signed)
Office Visit Note   Patient: Emma Gardner           Date of Birth: 1985-12-01           MRN: [id number] Visit Date: 11/08/2021              Requested by: Nicolette Bang, MD [address] [place],  [place] [address] PCP: Nicolette Bang, MD  Chief Complaint  Patient presents with   Lower Back - Pain      HPI: Emma Gardner is a pleasant 36 year old woman with a 11-month history of lower back pain.  Pain starts in her lower back and can shoot into her buttock especially on the right side.  Sometimes she gets tingling in her feet.  She feels like sometimes she cannot get comfortable to sleep.  She has not really done any treatment or seen anybody for this.  She denies any weakness or loss of bowel or bladder control  Assessment & Plan: Visit Diagnoses:  1. Chronic midline low back pain, unspecified whether sciatica present     Plan: Her x-rays are reassuring she had a conversation about conservative treatment.  I did give her low back exercises that she is going to do.  Just to see if we can calm down the inflammation we will put her on a Medrol Dosepak she understands the side effects and not to take ibuprofen with it.  She will follow-up in 3 weeks.  If she had continued problems we could consider an MRI  Follow-Up Instructions: Return in about 3 weeks (around 11/29/2021).   Ortho Exam  Patient is alert, oriented, no adenopathy, well-dressed, normal affect, normal respiratory effort. Examination of her lower back she does have some pain with forward flexion and extension.  No pain with side to side.  Sitting she has 5 out of 5 strength with resisted dorsiflexion plantarflexion of her ankles extension flexion of her legs and flexion of her hip.  She does have some general tenderness across the lower back more into the right buttock than the left buttock sensation and distal circulation is intact today.  Imaging: No results found. No images are attached to the  encounter.  Labs: Lab Results  Component Value Date   HGBA1C 5.3 10/21/2019   ESRSEDRATE 10 03/31/2008   LABORGA NO GROWTH 03/28/2013     Lab Results  Component Value Date   ALBUMIN 4.3 10/21/2019   ALBUMIN 3.8 10/05/2013   ALBUMIN 4.0 08/12/2012    No results found for: "MG" Lab Results  Component Value Date   VD25OH 26 (L) 01/17/2013    No results found for: "PREALBUMIN"    Latest Ref Rng & Units 10/21/2019    8:39 AM 07/29/2019    7:35 AM 10/02/2017    5:18 AM  CBC EXTENDED  WBC 3.4 - 10.8 x10E3/uL 7.4  6.4  10.8   RBC 3.77 - 5.28 x10E6/uL 3.96  3.85  3.38   Hemoglobin 11.1 - 15.9 g/dL 12.7  12.4  10.8   HCT 34.0 - 46.6 % 37.7  36.7  31.5   Platelets 150 - 450 x10E3/uL 363  330  271   NEUT# 1.4 - 7.0 x10E3/uL 3.3     Lymph# 0.7 - 3.1 x10E3/uL 3.3        There is no height or weight on file to calculate BMI.  Orders:  Orders Placed This Encounter  Procedures   XR Lumbar Spine 2-3 Views   Meds ordered this encounter  Medications   methylPREDNISolone (MEDROL DOSEPAK) 4 MG TBPK tablet    Sig: Take as directed    Dispense:  30 tablet    Refill:  0     Procedures: No procedures performed  Clinical Data: No additional findings.  ROS:  All other systems negative, except as noted in the HPI. Review of Systems  Objective: Vital Signs: LMP 09/10/2017   Specialty Comments:  No specialty comments available.  PMFS History: Patient Active Problem List   Diagnosis Date Noted   Panic attacks 10/18/2019   Chronic pelvic pain in female 10/07/2019   S/P laparoscopic assisted vaginal hysterectomy (LAVH) 10/01/2017   Generalized anxiety disorder    Situational stress 03/17/2012   Past Medical History:  Diagnosis Date   Abnormal Pap smear    GERD (gastroesophageal reflux disease)    H. pylori infection 2008   ?reinfx - re-tx 03/2012 triple pk   Headache(784.0)    otc med prn   Ovarian cyst    Ovarian cyst    SVD (spontaneous vaginal delivery)    x 2     Family History  Problem Relation Age of Onset   Depression Mother    Dementia Paternal Grandfather    Dementia Paternal Grandmother    Depression Maternal Grandmother     Past Surgical History:  Procedure Laterality Date   CERVICAL BIOPSY  yrs ago   CYSTOSCOPY N/A 10/01/2017   Procedure: CYSTOSCOPY;  Surgeon: Paula Compton, MD;  Location: [facility];  Service: Gynecology;  Laterality: N/A;   ENDOMETRIAL BIOPSY  yrs ago   normal   ESOPHAGOGASTRODUODENOSCOPY  04/12/2008   ESSURE TUBAL LIGATION  07/2011   LABIOPLASTY Bilateral 07/29/2019   Procedure: LABIAPLASTY;  Surgeon: Paula Compton, MD;  Location: [facility];  Service: Gynecology;  Laterality: Bilateral;   LAPAROSCOPIC ASSISTED VAGINAL HYSTERECTOMY N/A 10/01/2017   Procedure: LAPAROSCOPIC ASSISTED VAGINAL HYSTERECTOMY;  Surgeon: Paula Compton, MD;  Location: [facility];  Service: Gynecology;  Laterality: N/A;   LAPAROSCOPIC BILATERAL SALPINGECTOMY Bilateral 08/26/2013   Procedure: LAPAROSCOPIC BILATERAL SALPINGECTOMY;  Surgeon: Lahoma Crocker, MD;  Location: [facility];  Service: Gynecology;  Laterality: Bilateral;   LAPAROSCOPY N/A 06/04/2012   Procedure: LAPAROSCOPY DIAGNOSTIC;  Surgeon: Lahoma Crocker, MD;  Location: [facility];  Service: Gynecology;  Laterality: N/A;   TUBAL LIGATION     Social History   Occupational History    Employer: [employer]  Tobacco Use   Smoking status: Former    Types: Cigars    Quit date: 10/04/2010    Years since quitting: 11.1   Smokeless tobacco: Never  Vaping Use   Vaping Use: Never used  Substance and Sexual Activity   Alcohol use: No   Drug use: No   Sexual activity: Yes    Partners: Male    Birth control/protection: Surgical    Comment: Salpingectomy

## 2021-11-29 ENCOUNTER — Ambulatory Visit: Payer: Self-pay | Admitting: Physician Assistant

## 2022-03-10 ENCOUNTER — Ambulatory Visit: Payer: Self-pay | Admitting: Physician Assistant

## 2022-03-13 ENCOUNTER — Encounter: Payer: Self-pay | Admitting: Physician Assistant

## 2022-03-13 ENCOUNTER — Ambulatory Visit (INDEPENDENT_AMBULATORY_CARE_PROVIDER_SITE_OTHER): Payer: Self-pay | Admitting: Physician Assistant

## 2022-03-13 ENCOUNTER — Emergency Department (HOSPITAL_COMMUNITY)
Admission: EM | Admit: 2022-03-13 | Discharge: 2022-03-13 | Discharge status: Against Medical Advice | Payer: Self-pay | Attending: Student | Admitting: Student

## 2022-03-13 ENCOUNTER — Other Ambulatory Visit: Payer: Self-pay

## 2022-03-13 DIAGNOSIS — M25551 Pain in right hip: Secondary | ICD-10-CM | POA: Insufficient documentation

## 2022-03-13 DIAGNOSIS — M545 Low back pain, unspecified: Secondary | ICD-10-CM | POA: Insufficient documentation

## 2022-03-13 DIAGNOSIS — G8929 Other chronic pain: Secondary | ICD-10-CM

## 2022-03-13 DIAGNOSIS — Z5321 Procedure and treatment not carried out due to patient leaving prior to being seen by health care provider: Secondary | ICD-10-CM | POA: Insufficient documentation

## 2022-03-13 MED ORDER — METHOCARBAMOL 500 MG PO TABS: 500.0000 mg | ORAL_TABLET | Freq: Four times a day (QID) | ORAL | 0 refills | Status: DC | PRN

## 2022-03-13 MED ORDER — CYCLOBENZAPRINE HCL 10 MG PO TABS: 5.0000 mg | ORAL_TABLET | Freq: Once | ORAL | Status: DC

## 2022-03-13 MED ORDER — DEXAMETHASONE SODIUM PHOSPHATE 10 MG/ML IJ SOLN: 10.0000 mg | Freq: Once | INTRAMUSCULAR | Status: DC

## 2022-03-13 NOTE — ED Triage Notes (Signed)
Pt. Stated,  Im having rt. Hip and lower back pain and rt. Leg pain. I also have an ovarian cyst. I have appt today at the [facility] at 330 to schedule a MRI. This started after I fell with my dog and fell flat on my back In October.

## 2022-03-13 NOTE — ED Provider Triage Note (Addendum)
Emergency Medicine Provider Triage Evaluation Note  Emma Gardner , a 37 y.o. female  was evaluated in triage.  Pt complains of right-sided hip and leg pain.  She states that the symptoms have been ongoing for months.  She states that she fell months ago but is unsure if this caused her symptoms.  She describes having pain in her right buttock that intermittently shoots into her right thigh.  She states that the pain is unbearable.  She denies having numbness or tingling, bowel or bladder incontinence/retention.  She is already being seen by an orthopedic and states that she was supposed to go today to be scheduled for an MRI but decided to come here instead..  Review of Systems  Positive: See above Negative:   Physical Exam  BP 129/89 (BP Location: Right Arm)   Pulse 89   Temp 98.7 F (37.1 C) (Oral)   Resp 18   Ht 5' 6"$  (1.676 m)   Wt 90.7 kg   LMP 09/10/2017   SpO2 100%   BMI 32.28 kg/m  Gen:   Awake, crying on exam Resp:  Normal effort  MSK:   Moves extremities without difficulty  Other:  No midline tenderness, strength is equal  Medical Decision Making  Medically screening exam initiated at 1:12 PM.  Appropriate orders placed.  Elgie Congo was informed that the remainder of the evaluation will be completed by another provider, this initial triage assessment does not replace that evaluation, and the importance of remaining in the ED until their evaluation is complete.  Suspect radiculopathy, will get plain film hip since I do not see record of this.     Mickie Hillier, PA-C 03/13/22 1313    Mickie Hillier, PA-C 03/13/22 1314

## 2022-03-13 NOTE — ED Notes (Signed)
Patient informed that she is leaving and going to her appointment at [facility].

## 2022-03-13 NOTE — Progress Notes (Signed)
Office Visit Note   Patient: Emma Gardner           Date of Birth: 1985/03/07           MRN: [id number] Visit Date: 03/13/2022              Requested by: No referring provider defined for this encounter. PCP: [facility]  Chief Complaint  Patient presents with   Lower Back - Pain      HPI: Emma Gardner is a pleasant  37 year old woman with a history of low back pain that radiates down the back of her right leg.  I last saw her a few months ago.  At that time she was prescribed a Medrol Dosepak.  We discussed that if things got worse or did not get better we get an MRI.  She did not take the Dosepak she said she just does not like taking pills.  She is just had increasing symptoms that radiate down the back of her leg.  Denies any loss of bowel or bladder control denies any radiation below the knee.  She did go to the emergency room today but opted to come here  Assessment & Plan: Visit Diagnoses: Low back pain with radiculopathy.  Plan: Will go forward and order an MRI today.  In the meantime she is willing to try a Toradol injection.  I will also call her in a low-dose of a muscle relaxant we will follow-up with her after the MRI  Follow-Up Instructions: Return after MRI.   Ortho Exam  Patient is alert, oriented, no adenopathy, well-dressed, normal affect, normal respiratory effort. Examination patient is tearful.  She is focally tender over the right lower buttock.  Pain increases with straight leg raise.  Sensation is intact.  She has good dorsiflexion plantarflexion extension and flexion of her legs.  No pain with manipulation of her hip no tenderness over the trochanteric bursa  Imaging: No results found. No images are attached to the encounter.  Labs: Lab Results  Component Value Date   HGBA1C 5.3 10/21/2019   ESRSEDRATE 10 03/31/2008   LABORGA NO GROWTH 03/28/2013     Lab Results  Component Value Date   ALBUMIN 4.3 10/21/2019   ALBUMIN  3.8 10/05/2013   ALBUMIN 4.0 08/12/2012    No results found for: "MG" Lab Results  Component Value Date   VD25OH 26 (L) 01/17/2013    No results found for: "PREALBUMIN"    Latest Ref Rng & Units 10/21/2019    8:39 AM 07/29/2019    7:35 AM 10/02/2017    5:18 AM  CBC EXTENDED  WBC 3.4 - 10.8 x10E3/uL 7.4  6.4  10.8   RBC 3.77 - 5.28 x10E6/uL 3.96  3.85  3.38   Hemoglobin 11.1 - 15.9 g/dL 12.7  12.4  10.8   HCT 34.0 - 46.6 % 37.7  36.7  31.5   Platelets 150 - 450 x10E3/uL 363  330  271   NEUT# 1.4 - 7.0 x10E3/uL 3.3     Lymph# 0.7 - 3.1 x10E3/uL 3.3        There is no height or weight on file to calculate BMI.  Orders:  No orders of the defined types were placed in this encounter.  Meds ordered this encounter  Medications   methocarbamol (ROBAXIN) 500 MG tablet    Sig: Take 1 tablet (500 mg total) by mouth every 6 (six) hours as needed for muscle spasms.  Dispense:  30 tablet    Refill:  0     Procedures: No procedures performed  Clinical Data: No additional findings.  ROS:  All other systems negative, except as noted in the HPI. Review of Systems  Objective: Vital Signs: LMP 09/10/2017   Specialty Comments:  No specialty comments available.  PMFS History: Patient Active Problem List   Diagnosis Date Noted   Low back pain 11/08/2021   Panic attacks 10/18/2019   Chronic pelvic pain in female 10/07/2019   S/P laparoscopic assisted vaginal hysterectomy (LAVH) 10/01/2017   Generalized anxiety disorder    Situational stress 03/17/2012   Past Medical History:  Diagnosis Date   Abnormal Pap smear    GERD (gastroesophageal reflux disease)    H. pylori infection 2008   ?reinfx - re-tx 03/2012 triple pk   Headache(784.0)    otc med prn   Ovarian cyst    Ovarian cyst    SVD (spontaneous vaginal delivery)    x 2    Family History  Problem Relation Age of Onset   Depression Mother    Dementia Paternal Grandfather    Dementia Paternal Grandmother     Depression Maternal Grandmother     Past Surgical History:  Procedure Laterality Date   CERVICAL BIOPSY  yrs ago   CYSTOSCOPY N/A 10/01/2017   Procedure: CYSTOSCOPY;  Surgeon: Emma Compton, MD;  Location: [facility];  Service: Gynecology;  Laterality: N/A;   ENDOMETRIAL BIOPSY  yrs ago   normal   ESOPHAGOGASTRODUODENOSCOPY  04/12/2008   ESSURE TUBAL LIGATION  07/2011   LABIOPLASTY Bilateral 07/29/2019   Procedure: LABIAPLASTY;  Surgeon: Emma Compton, MD;  Location: [facility];  Service: Gynecology;  Laterality: Bilateral;   LAPAROSCOPIC ASSISTED VAGINAL HYSTERECTOMY N/A 10/01/2017   Procedure: LAPAROSCOPIC ASSISTED VAGINAL HYSTERECTOMY;  Surgeon: Emma Compton, MD;  Location: [facility];  Service: Gynecology;  Laterality: N/A;   LAPAROSCOPIC BILATERAL SALPINGECTOMY Bilateral 08/26/2013   Procedure: LAPAROSCOPIC BILATERAL SALPINGECTOMY;  Surgeon: Emma Crocker, MD;  Location: [facility];  Service: Gynecology;  Laterality: Bilateral;   LAPAROSCOPY N/A 06/04/2012   Procedure: LAPAROSCOPY DIAGNOSTIC;  Surgeon: Emma Crocker, MD;  Location: [facility];  Service: Gynecology;  Laterality: N/A;   TUBAL LIGATION     Social History   Occupational History    Employer: [employer]  Tobacco Use   Smoking status: Former    Types: Cigars    Quit date: 10/04/2010    Years since quitting: 11.4   Smokeless tobacco: Never  Vaping Use   Vaping Use: Never used  Substance and Sexual Activity   Alcohol use: No   Drug use: No   Sexual activity: Yes    Partners: Male    Birth control/protection: Surgical    Comment: Salpingectomy

## 2022-03-21 ENCOUNTER — Telehealth: Payer: Self-pay | Admitting: Physician Assistant

## 2022-03-21 ENCOUNTER — Other Ambulatory Visit: Payer: Self-pay

## 2022-03-21 NOTE — Telephone Encounter (Signed)
Called pt 1X and left vm for pt to call and set an MRI review appt with PA Persons after 03/28/22

## 2022-03-26 ENCOUNTER — Telehealth: Payer: Self-pay | Admitting: Physician Assistant

## 2022-03-26 NOTE — Telephone Encounter (Signed)
Called pt 2X and left vm for pt to call and set an MRI review appt with PA Persons after 2/23

## 2022-03-28 ENCOUNTER — Ambulatory Visit
Admission: RE | Admit: 2022-03-28 | Discharge: 2022-03-28 | Disposition: A | Discharge status: Home / Self Care | Payer: No Typology Code available for payment source | Source: Ambulatory Visit | Attending: Physician Assistant | Admitting: Physician Assistant

## 2022-03-28 DIAGNOSIS — G8929 Other chronic pain: Secondary | ICD-10-CM

## 2022-03-31 ENCOUNTER — Telehealth: Payer: Self-pay | Admitting: Physician Assistant

## 2022-03-31 NOTE — Telephone Encounter (Signed)
Patient states she missed  call from [facility]

## 2022-03-31 NOTE — Telephone Encounter (Signed)
Please advise 

## 2022-04-01 ENCOUNTER — Other Ambulatory Visit: Payer: Self-pay | Admitting: Physician Assistant

## 2022-04-01 ENCOUNTER — Telehealth: Payer: Self-pay | Admitting: Physician Assistant

## 2022-04-01 DIAGNOSIS — M545 Low back pain, unspecified: Secondary | ICD-10-CM

## 2022-04-01 MED ORDER — MELOXICAM 15 MG PO TABS: 15.0000 mg | ORAL_TABLET | Freq: Every day | ORAL | 0 refills | Status: DC

## 2022-04-01 NOTE — Telephone Encounter (Signed)
Patient states she missed a call from  [facility]

## 2022-04-22 ENCOUNTER — Ambulatory Visit: Payer: Self-pay | Attending: Physician Assistant

## 2022-06-26 ENCOUNTER — Ambulatory Visit
Admission: RE | Admit: 2022-06-26 | Discharge: 2022-06-26 | Disposition: A | Discharge status: Home / Self Care | Payer: Self-pay | Source: Ambulatory Visit | Attending: Family Medicine | Admitting: Family Medicine

## 2022-06-26 VITALS — BP 114/82 | HR 67 | Temp 98.3°F | Resp 18

## 2022-06-26 DIAGNOSIS — J029 Acute pharyngitis, unspecified: Secondary | ICD-10-CM | POA: Insufficient documentation

## 2022-06-26 LAB — POCT RAPID STREP A (OFFICE): Rapid Strep A Screen: NEGATIVE

## 2022-06-26 MED ORDER — KETOROLAC TROMETHAMINE 30 MG/ML IJ SOLN: 30.0000 mg | Freq: Once | INTRAMUSCULAR | Status: DC

## 2022-06-26 MED ORDER — KETOROLAC TROMETHAMINE 10 MG PO TABS: 10.0000 mg | ORAL_TABLET | Freq: Four times a day (QID) | ORAL | 0 refills | Status: AC | PRN

## 2022-06-26 MED ORDER — KETOROLAC TROMETHAMINE 15 MG/ML IJ SOLN
30.0000 mg | Freq: Once | INTRAMUSCULAR | Status: AC
  Administered 2022-06-26: 30 mg via INTRAMUSCULAR

## 2022-06-26 NOTE — Discharge Instructions (Addendum)
Your strep test is negative.  Culture of the throat will be sent, and staff will notify you if that is in turn positive.  You have been given a shot of Toradol 30 mg today.  Ketorolac 10 mg tablets--take 1 tablet every 6 hours as needed for pain.  This is the same medicine that is in the shot we just gave you

## 2022-06-26 NOTE — ED Triage Notes (Signed)
Sore throat that started yesterday with fatigue, body aches, chills. Taking ibuprofen.

## 2022-06-26 NOTE — ED Provider Notes (Signed)
[facility]    CSN: [id number] Arrival date & time: 06/26/22  1603      History   Chief Complaint Chief Complaint  Patient presents with   Sore Throat    Entered by patient    HPI Emma Gardner is a 37 y.o. female.    Sore Throat   here for sore throat that began yesterday morning.  She is felt like her lymph nodes are swollen on her neck.  She had some fever yesterday but it is better today.  Ears are also bothering her today.  Has had a little bit of a dry cough that started today.  Some nausea but no vomiting    Past Medical History:  Diagnosis Date   Abnormal Pap smear    GERD (gastroesophageal reflux disease)    H. 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/01/2017   Generalized anxiety disorder    Situational stress 03/17/2012    Past Surgical History:  Procedure Laterality Date   CERVICAL BIOPSY  yrs ago   CYSTOSCOPY N/A 10/01/2017   Procedure: CYSTOSCOPY;  Surgeon: Huel Cote, MD;  Location: [facility];  Service: Gynecology;  Laterality: N/A;   ENDOMETRIAL BIOPSY  yrs ago   normal   ESOPHAGOGASTRODUODENOSCOPY  04/12/2008   ESSURE TUBAL LIGATION  07/2011   LABIOPLASTY Bilateral 07/29/2019   Procedure: LABIAPLASTY;  Surgeon: Huel Cote, MD;  Location: [facility];  Service: Gynecology;  Laterality: Bilateral;   LAPAROSCOPIC ASSISTED VAGINAL HYSTERECTOMY N/A 10/01/2017   Procedure: LAPAROSCOPIC ASSISTED VAGINAL HYSTERECTOMY;  Surgeon: Huel Cote, MD;  Location: [facility];  Service: Gynecology;  Laterality: N/A;   LAPAROSCOPIC BILATERAL  SALPINGECTOMY Bilateral 08/26/2013   Procedure: LAPAROSCOPIC BILATERAL SALPINGECTOMY;  Surgeon: Antionette Char, MD;  Location: [facility];  Service: Gynecology;  Laterality: Bilateral;   LAPAROSCOPY N/A 06/04/2012   Procedure: LAPAROSCOPY DIAGNOSTIC;  Surgeon: Antionette Char, MD;  Location: [facility];  Service: Gynecology;  Laterality: N/A;   TUBAL LIGATION      OB History     Gravida  3   Para  3   Term  3   Preterm      AB      Living  3      SAB      IAB      Ectopic      Multiple      Live Births  3            Home Medications    Prior to Admission medications   Medication Sig Start Date End Date Taking? Authorizing Provider  ketorolac (TORADOL) 10 MG tablet Take 1 tablet (10 mg total) by mouth every 6 (six) hours as needed (pain). 06/26/22  Yes Zenia Resides, MD    Family History Family History  Problem Relation Age of Onset   Depression Mother    Dementia Paternal Grandfather    Dementia Paternal Grandmother    Depression Maternal Grandmother     Social History Social History   Tobacco Use   Smoking status:  Former    Types: Cigars    Quit date: 10/04/2010    Years since quitting: 11.7   Smokeless tobacco: Never  Vaping Use   Vaping Use: Never used  Substance Use Topics   Alcohol use: No   Drug use: No     Allergies   Percocet [oxycodone-acetaminophen]   Review of Systems Review of Systems   Physical Exam Triage Vital Signs ED Triage Vitals [06/26/22 1625]  Enc Vitals Group     BP 114/82     Pulse Rate 67     Resp 18     Temp 98.3 F (36.8 C)     Temp Source Oral     SpO2 97 %     Weight      Height      Head Circumference      Peak Flow      Pain Score 9     Pain Loc      Pain Edu?      Excl. in GC?    No data found.  Updated Vital Signs BP 114/82 (BP Location: Right Arm)   Pulse 67   Temp 98.3 F (36.8 C) (Oral)   Resp 18   LMP 09/10/2017   SpO2 97%   Visual Acuity Right Eye Distance:   Left Eye  Distance:   Bilateral Distance:    Right Eye Near:   Left Eye Near:    Bilateral Near:     Physical Exam Vitals reviewed.  Constitutional:      General: She is not in acute distress.    Appearance: She is not toxic-appearing.  HENT:     Right Ear: Tympanic membrane and ear canal normal.     Left Ear: Tympanic membrane and ear canal normal.     Nose: Nose normal.     Mouth/Throat:     Mouth: Mucous membranes are moist.     Comments: There is erythema of the oropharynx with white patches on the tonsillar crypts. Eyes:     Extraocular Movements: Extraocular movements intact.     Conjunctiva/sclera: Conjunctivae normal.     Pupils: Pupils are equal, round, and reactive to light.  Cardiovascular:     Rate and Rhythm: Normal rate and regular rhythm.     Heart sounds: No murmur heard. Pulmonary:     Effort: Pulmonary effort is normal. No respiratory distress.     Breath sounds: No wheezing, rhonchi or rales.  Chest:     Chest wall: No tenderness.  Musculoskeletal:     Cervical back: Neck supple.  Lymphadenopathy:     Cervical: No cervical adenopathy.  Skin:    Capillary Refill: Capillary refill takes less than 2 seconds.     Coloration: Skin is not jaundiced or pale.  Neurological:     General: No focal deficit present.     Mental Status: She is alert and oriented to person, place, and time.  Psychiatric:        Behavior: Behavior normal.      UC Treatments / Results  Labs (all labs ordered are listed, but only abnormal results are displayed) Labs Reviewed  CULTURE, GROUP A STREP [facility])  POCT RAPID STREP A (OFFICE)    EKG   Radiology No results found.  Procedures Procedures (including critical care time)  Medications Ordered in UC Medications  ketorolac (TORADOL) 30 MG/ML injection 30 mg (has no administration in time range)    Initial Impression / Assessment and Plan / UC Course  I have reviewed the triage vital signs and the nursing notes.  Pertinent  labs & imaging results that were available during my care of the patient were reviewed by me and considered in my medical decision making (see chart for details).        Rapid strep is negative.  Throat culture is sent and we will treat per protocol and notify her if positive.  Toradol injection is given for the pain and Toradol is sent Final Clinical Impressions(s) / UC Diagnoses   Final diagnoses:  Acute pharyngitis, unspecified etiology     Discharge Instructions      Your strep test is negative.  Culture of the throat will be sent, and staff will notify you if that is in turn positive.  You have been given a shot of Toradol 30 mg today.  Ketorolac 10 mg tablets--take 1 tablet every 6 hours as needed for pain.  This is the same medicine that is in the shot we just gave you       ED Prescriptions     Medication Sig Dispense Auth. Provider   ketorolac (TORADOL) 10 MG tablet Take 1 tablet (10 mg total) by mouth every 6 (six) hours as needed (pain). 20 tablet Carmelita Amparo, Janace Aris, MD      PDMP not reviewed this encounter.   Zenia Resides, MD 06/26/22 1640

## 2022-06-29 LAB — CULTURE, GROUP A STREP (THRC)

## 2022-11-14 ENCOUNTER — Telehealth: Payer: Self-pay | Admitting: Physician Assistant

## 2022-11-14 ENCOUNTER — Other Ambulatory Visit: Payer: Self-pay | Admitting: Surgical

## 2022-11-14 MED ORDER — METHOCARBAMOL 500 MG PO TABS: 500.0000 mg | ORAL_TABLET | Freq: Three times a day (TID) | ORAL | 1 refills | Status: AC | PRN

## 2022-11-14 NOTE — Telephone Encounter (Signed)
Pt called in requesting methocarbamol sent to [facility] on file pt aware she has not been seen in a while and MA Persons is not currently in office

## 2022-11-14 NOTE — Telephone Encounter (Signed)
Sent in

## 2023-09-30 ENCOUNTER — Ambulatory Visit
Admission: RE | Admit: 2023-09-30 | Discharge: 2023-09-30 | Disposition: A | Discharge status: Home / Self Care | Payer: Self-pay | Source: Ambulatory Visit

## 2023-09-30 VITALS — BP 117/77 | HR 67 | Temp 98.3°F | Resp 17

## 2023-09-30 DIAGNOSIS — L239 Allergic contact dermatitis, unspecified cause: Secondary | ICD-10-CM

## 2023-09-30 NOTE — ED Provider Notes (Signed)
 [facility]    CSN: [id number] Arrival date & time: 09/30/23  9071      History   Chief Complaint Chief Complaint  Patient presents with   Insect Bite   Rash    HPI Emma Gardner is a 38 y.o. female.   Patient presents to clinic over concern of an itchy rash to her right lower abdominal area that she noticed 5 days ago.  Thinks she was potentially bit by something such as a spider on Saturday night.  [place]  She has a hard itchy area to the right lower abdomen that she has been applying calamine lotion and hydrocortisone cream.  When she puts the cream on it does help, reports when she showers the area feels more tender and itchy.  Has never had a reaction like this before, allergies to Percocet, no other known allergies.  Denies other areas of involvement.  Reports the firmness has improved.  Has not had any drainage.  Afebrile.  The history is provided by the patient and medical records.  Rash   Past Medical History:  Diagnosis Date   Abnormal Pap smear    GERD (gastroesophageal reflux disease)    H. 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/01/2017   Generalized anxiety disorder    Situational stress 03/17/2012    Past Surgical History:  Procedure Laterality Date   CERVICAL BIOPSY  yrs ago   CYSTOSCOPY N/A 10/01/2017   Procedure: CYSTOSCOPY;  Surgeon: Estelle Service, MD;  Location: [facility];  Service: Gynecology;  Laterality: N/A;   ENDOMETRIAL BIOPSY  yrs ago   normal   ESOPHAGOGASTRODUODENOSCOPY  04/12/2008   ESSURE TUBAL LIGATION  07/2011   LABIOPLASTY Bilateral  07/29/2019   Procedure: LABIAPLASTY;  Surgeon: Estelle Service, MD;  Location: [facility];  Service: Gynecology;  Laterality: Bilateral;   LAPAROSCOPIC ASSISTED VAGINAL HYSTERECTOMY N/A 10/01/2017   Procedure: LAPAROSCOPIC ASSISTED VAGINAL HYSTERECTOMY;  Surgeon: Estelle Service, MD;  Location: [facility];  Service: Gynecology;  Laterality: N/A;   LAPAROSCOPIC BILATERAL SALPINGECTOMY Bilateral 08/26/2013   Procedure: LAPAROSCOPIC BILATERAL SALPINGECTOMY;  Surgeon: Olam Mill, MD;  Location: [facility];  Service: Gynecology;  Laterality: Bilateral;   LAPAROSCOPY N/A 06/04/2012   Procedure: LAPAROSCOPY DIAGNOSTIC;  Surgeon: Olam Mill, MD;  Location: [facility];  Service: Gynecology;  Laterality: N/A;   TUBAL LIGATION      OB History     Gravida  3   Para  3   Term  3   Preterm      AB      Living  3      SAB      IAB      Ectopic      Multiple      Live Births  3            Home Medications    Prior to Admission medications   Medication Sig Start Date End Date Taking? Authorizing Provider  methocarbamol  (ROBAXIN ) 500 MG tablet Take 1 tablet (  500 mg total) by mouth every 8 (eight) hours as needed for muscle spasms. 11/14/22   Magnant, Charles L, PA-C  ketorolac  (TORADOL ) 10 MG tablet Take 1 tablet (10 mg total) by mouth every 6 (six) hours as needed (pain). 06/26/22   Vonna Sharlet POUR, MD    Family History Family History  Problem Relation Age of Onset   Depression Mother    Dementia Paternal Grandfather    Dementia Paternal Grandmother    Depression Maternal Grandmother     Social History Social History   Tobacco Use   Smoking status: Former    Types: Cigars    Quit date: 10/04/2010    Years since quitting: 12.9   Smokeless tobacco: Never  Vaping Use   Vaping status: Never Used  Substance Use Topics   Alcohol use: No   Drug use: No     Allergies   Percocet [oxycodone -acetaminophen ]   Review of  Systems Review of Systems  Per HPI  Physical Exam Triage Vital Signs ED Triage Vitals  Encounter Vitals Group     BP 09/30/23 0943 117/77     Girls Systolic BP Percentile --      Girls Diastolic BP Percentile --      Boys Systolic BP Percentile --      Boys Diastolic BP Percentile --      Pulse Rate 09/30/23 0943 67     Resp 09/30/23 0943 17     Temp 09/30/23 0943 98.3 F (36.8 C)     Temp Source 09/30/23 0943 Oral     SpO2 09/30/23 0943 96 %     Weight --      Height --      Head Circumference --      Peak Flow --      Pain Score 09/30/23 0942 0     Pain Loc --      Pain Education --      Exclude from Growth Chart --    No data found.  Updated Vital Signs BP 117/77 (BP Location: Right Arm)   Pulse 67   Temp 98.3 F (36.8 C) (Oral)   Resp 17   LMP 09/10/2017   SpO2 96%   Visual Acuity Right Eye Distance:   Left Eye Distance:   Bilateral Distance:    Right Eye Near:   Left Eye Near:    Bilateral Near:     Physical Exam Vitals and nursing note reviewed.  Constitutional:      Appearance: Normal appearance.  HENT:     Head: Normocephalic and atraumatic.     Right Ear: External ear normal.     Left Ear: External ear normal.     Nose: Nose normal.     Mouth/Throat:     Mouth: Mucous membranes are moist.  Eyes:     Conjunctiva/sclera: Conjunctivae normal.  Cardiovascular:     Rate and Rhythm: Normal rate.     Pulses: Normal pulses.  Pulmonary:     Effort: Pulmonary effort is normal. No respiratory distress.  Musculoskeletal:        General: Normal range of motion.  Skin:    Findings: Rash present.         Comments: Erythematous rash to the right lower abdominal area, areas without induration or drainage.  Neurological:     General: No focal deficit present.     Mental Status: She is alert and oriented to person, place, and time.  Psychiatric:  Mood and Affect: Mood normal.        Behavior: Behavior normal. Behavior is cooperative.       UC Treatments / Results  Labs (all labs ordered are listed, but only abnormal results are displayed) Labs Reviewed - No data to display  EKG   Radiology No results found.  Procedures Procedures (including critical care time)  Medications Ordered in UC Medications - No data to display  Initial Impression / Assessment and Plan / UC Course  I have reviewed the triage vital signs and the nursing notes.  Pertinent labs & imaging results that were available during my care of the patient were reviewed by me and considered in my medical decision making (see chart for details).  Vitals and triage reviewed, patient is hemodynamically stable.  Erythematous rash to the right lower abdomen appears to be superficial and consistent with allergic contact dermatitis.  Without fever, drainage or induration, lower concern for cellulitis or bacterial ideology at this time.  Symptomatic management with antihistamine and steroid cream discussed.  Plan of care, follow-up care return precautions given, no questions at this time.     Final Clinical Impressions(s) / UC Diagnoses   Final diagnoses:  Allergic contact dermatitis, unspecified trigger     Discharge Instructions      You have a localized allergic reaction, could be due to an insect bite.  Continue to place hydrocortisone on the area to help with itching.  Topical Benadryl  cream is also available over-the-counter.  I suggest taking a daily antihistamine, such as Claritin or Zyrtec.  For any breakthrough itching you can take Benadryl  before you go to bed, consider breaking the 25 mg tablet in half for a 12.5 mg dose to help decrease drowsiness.  Avoid itching the area.  Area should heal and improve over the weekend.  If you notice any fever, drainage, spreading of redness or firmness please return to clinic or follow-up with PCP for reevaluation.     ED Prescriptions   None    PDMP not reviewed this encounter.   Dreama,  Zayonna Ayuso  N, FNP 09/30/23 1002

## 2023-09-30 NOTE — Discharge Instructions (Signed)
 You have a localized allergic reaction, could be due to an insect bite.  Continue to place hydrocortisone on the area to help with itching.  Topical Benadryl  cream is also available over-the-counter.  I suggest taking a daily antihistamine, such as Claritin or Zyrtec.  For any breakthrough itching you can take Benadryl  before you go to bed, consider breaking the 25 mg tablet in half for a 12.5 mg dose to help decrease drowsiness.  Avoid itching the area.  Area should heal and improve over the weekend.  If you notice any fever, drainage, spreading of redness or firmness please return to clinic or follow-up with PCP for reevaluation.

## 2023-09-30 NOTE — ED Triage Notes (Signed)
 Pt states she thinks she was bite by a spider on Sunday. She had hard, red, itchy painful area on right lower abdomin. Site is not painful today but is still very itchy.
